# Patient Record
Sex: Male | Born: 1960 | Race: White | Hispanic: No | Marital: Married | Smoking: Former smoker
Health system: Southern US, Community
[De-identification: ages and names within clinical notes are randomized; demographics above are authoritative.]

## PROBLEM LIST (undated history)

## (undated) DIAGNOSIS — R51 Headache: Secondary | ICD-10-CM

## (undated) DIAGNOSIS — N400 Enlarged prostate without lower urinary tract symptoms: Secondary | ICD-10-CM

## (undated) DIAGNOSIS — K759 Inflammatory liver disease, unspecified: Secondary | ICD-10-CM

## (undated) DIAGNOSIS — K219 Gastro-esophageal reflux disease without esophagitis: Secondary | ICD-10-CM

## (undated) DIAGNOSIS — R519 Headache, unspecified: Secondary | ICD-10-CM

## (undated) DIAGNOSIS — N12 Tubulo-interstitial nephritis, not specified as acute or chronic: Secondary | ICD-10-CM

## (undated) DIAGNOSIS — A419 Sepsis, unspecified organism: Secondary | ICD-10-CM

## (undated) HISTORY — PX: EYE SURGERY: SHX253

## (undated) HISTORY — PX: COLONOSCOPY: SHX174

## (undated) HISTORY — PX: RETINAL LASER PROCEDURE: SHX2339

## (undated) HISTORY — DX: Benign prostatic hyperplasia without lower urinary tract symptoms: N40.0

## (undated) HISTORY — PX: LEG SURGERY: SHX1003

## (undated) HISTORY — DX: Inflammatory liver disease, unspecified: K75.9

---

## 2008-04-17 ENCOUNTER — Ambulatory Visit: Payer: Self-pay | Admitting: Urology

## 2008-04-24 ENCOUNTER — Ambulatory Visit: Payer: Self-pay | Admitting: Urology

## 2012-05-30 ENCOUNTER — Ambulatory Visit: Payer: Self-pay | Admitting: Unknown Physician Specialty

## 2016-04-21 ENCOUNTER — Other Ambulatory Visit: Payer: Self-pay | Admitting: Orthopedic Surgery

## 2016-04-21 DIAGNOSIS — M25562 Pain in left knee: Principal | ICD-10-CM

## 2016-04-21 DIAGNOSIS — G8929 Other chronic pain: Secondary | ICD-10-CM

## 2016-05-01 DIAGNOSIS — A419 Sepsis, unspecified organism: Secondary | ICD-10-CM

## 2016-05-01 DIAGNOSIS — N12 Tubulo-interstitial nephritis, not specified as acute or chronic: Secondary | ICD-10-CM

## 2016-05-01 HISTORY — DX: Tubulo-interstitial nephritis, not specified as acute or chronic: N12

## 2016-05-01 HISTORY — DX: Sepsis, unspecified organism: A41.9

## 2016-05-11 ENCOUNTER — Inpatient Hospital Stay
Admission: EM | Admit: 2016-05-11 | Discharge: 2016-05-14 | DRG: 872 | Disposition: A | Discharge status: Home / Self Care | Payer: No Typology Code available for payment source | Attending: Internal Medicine | Admitting: Internal Medicine

## 2016-05-11 ENCOUNTER — Encounter: Payer: Self-pay | Admitting: Emergency Medicine

## 2016-05-11 ENCOUNTER — Emergency Department: Payer: No Typology Code available for payment source

## 2016-05-11 ENCOUNTER — Emergency Department
Admission: EM | Admit: 2016-05-11 | Discharge: 2016-05-11 | Discharge status: Absent without leave | Payer: Self-pay | Attending: Emergency Medicine | Admitting: Emergency Medicine

## 2016-05-11 DIAGNOSIS — G8929 Other chronic pain: Secondary | ICD-10-CM

## 2016-05-11 DIAGNOSIS — S83242A Other tear of medial meniscus, current injury, left knee, initial encounter: Secondary | ICD-10-CM | POA: Diagnosis present

## 2016-05-11 DIAGNOSIS — A4151 Sepsis due to Escherichia coli [E. coli]: Secondary | ICD-10-CM | POA: Diagnosis present

## 2016-05-11 DIAGNOSIS — M25562 Pain in left knee: Secondary | ICD-10-CM

## 2016-05-11 DIAGNOSIS — N12 Tubulo-interstitial nephritis, not specified as acute or chronic: Secondary | ICD-10-CM | POA: Diagnosis present

## 2016-05-11 DIAGNOSIS — Z87891 Personal history of nicotine dependence: Secondary | ICD-10-CM

## 2016-05-11 DIAGNOSIS — A419 Sepsis, unspecified organism: Secondary | ICD-10-CM

## 2016-05-11 DIAGNOSIS — R109 Unspecified abdominal pain: Secondary | ICD-10-CM

## 2016-05-11 LAB — URINE DRUG SCREEN, QUALITATIVE (ARMC ONLY)
AMPHETAMINES, UR SCREEN: NOT DETECTED
BENZODIAZEPINE, UR SCRN: NOT DETECTED
Barbiturates, Ur Screen: NOT DETECTED
Cannabinoid 50 Ng, Ur ~~LOC~~: NOT DETECTED
Cocaine Metabolite,Ur ~~LOC~~: NOT DETECTED
MDMA (Ecstasy)Ur Screen: NOT DETECTED
METHADONE SCREEN, URINE: NOT DETECTED
OPIATE, UR SCREEN: NOT DETECTED
Phencyclidine (PCP) Ur S: NOT DETECTED
Tricyclic, Ur Screen: NOT DETECTED

## 2016-05-11 LAB — URINALYSIS COMPLETE WITH MICROSCOPIC (ARMC ONLY)
BACTERIA UA: NONE SEEN
BILIRUBIN URINE: NEGATIVE
Glucose, UA: NEGATIVE mg/dL
Hgb urine dipstick: NEGATIVE
Nitrite: NEGATIVE
PH: 9 — AB (ref 5.0–8.0)
Protein, ur: 100 mg/dL — AB
SQUAMOUS EPITHELIAL / LPF: NONE SEEN
Specific Gravity, Urine: 1.015 (ref 1.005–1.030)

## 2016-05-11 LAB — COMPREHENSIVE METABOLIC PANEL
ALBUMIN: 4.4 g/dL (ref 3.5–5.0)
ALT: 16 U/L — AB (ref 17–63)
AST: 26 U/L (ref 15–41)
Alkaline Phosphatase: 60 U/L (ref 38–126)
Anion gap: 11 (ref 5–15)
BUN: 15 mg/dL (ref 6–20)
CHLORIDE: 102 mmol/L (ref 101–111)
CO2: 22 mmol/L (ref 22–32)
CREATININE: 1.09 mg/dL (ref 0.61–1.24)
Calcium: 9.4 mg/dL (ref 8.9–10.3)
GFR calc Af Amer: >60 mL/min (ref >60)
GLUCOSE: 126 mg/dL — AB (ref 65–99)
Potassium: 3.7 mmol/L (ref 3.5–5.1)
SODIUM: 135 mmol/L (ref 135–145)
Total Bilirubin: 1.3 mg/dL — ABNORMAL HIGH (ref 0.3–1.2)
Total Protein: 7.3 g/dL (ref 6.5–8.1)

## 2016-05-11 LAB — GLUCOSE, CAPILLARY
GLUCOSE-CAPILLARY: 110 mg/dL — AB (ref 65–99)
Glucose-Capillary: 84 mg/dL (ref 65–99)

## 2016-05-11 LAB — CBC WITH DIFFERENTIAL/PLATELET
Basophils Absolute: 0 10*3/uL (ref 0–0.1)
Basophils Relative: 0 %
Eosinophils Absolute: 0 10*3/uL (ref 0–0.7)
Eosinophils Relative: 0 %
HCT: 42.7 % (ref 40.0–52.0)
HEMOGLOBIN: 14.4 g/dL (ref 13.0–18.0)
LYMPHS ABS: 0.9 10*3/uL — AB (ref 1.0–3.6)
LYMPHS PCT: 6 %
MCH: 29.4 pg (ref 26.0–34.0)
MCHC: 33.7 g/dL (ref 32.0–36.0)
MCV: 87.1 fL (ref 80.0–100.0)
Monocytes Absolute: 0.2 10*3/uL (ref 0.2–1.0)
Monocytes Relative: 1 %
NEUTROS ABS: 15.2 10*3/uL — AB (ref 1.4–6.5)
NEUTROS PCT: 93 %
Platelets: 253 10*3/uL (ref 150–440)
RBC: 4.9 MIL/uL (ref 4.40–5.90)
RDW: 13 % (ref 11.5–14.5)
WBC: 16.3 10*3/uL — AB (ref 3.8–10.6)

## 2016-05-11 LAB — TROPONIN I: Troponin I: <0.03 ng/mL (ref <0.03)

## 2016-05-11 LAB — ETHANOL: Alcohol, Ethyl (B): <5 mg/dL (ref <5)

## 2016-05-11 LAB — LACTIC ACID, PLASMA
Lactic Acid, Venous: 3.6 mmol/L (ref 0.5–1.9)
Lactic Acid, Venous: 2 mmol/L (ref 0.5–1.9)

## 2016-05-11 LAB — MRSA PCR SCREENING: MRSA by PCR: NEGATIVE

## 2016-05-11 MED ORDER — SODIUM CHLORIDE 0.9 % IV BOLUS (SEPSIS): 250.0000 mL | Freq: Once | INTRAVENOUS | Status: DC

## 2016-05-11 MED ORDER — SODIUM CHLORIDE 0.9 % IV BOLUS (SEPSIS)
1000.0000 mL | Freq: Once | INTRAVENOUS | Status: AC
  Administered 2016-05-11: 1000 mL via INTRAVENOUS

## 2016-05-11 MED ORDER — SODIUM CHLORIDE 0.9 % IV SOLN
INTRAVENOUS | Status: DC
  Administered 2016-05-11 – 2016-05-14 (×6): via INTRAVENOUS

## 2016-05-11 MED ORDER — SODIUM CHLORIDE 0.9% FLUSH
3.0000 mL | Freq: Two times a day (BID) | INTRAVENOUS | Status: DC
  Administered 2016-05-11 – 2016-05-12 (×3): 3 mL via INTRAVENOUS

## 2016-05-11 MED ORDER — ENOXAPARIN SODIUM 40 MG/0.4ML ~~LOC~~ SOLN
40.0000 mg | SUBCUTANEOUS | Status: DC
Start: 2016-05-11 — End: 2016-05-11

## 2016-05-11 MED ORDER — ZIPRASIDONE MESYLATE 20 MG IM SOLR
5.0000 mg | Freq: Once | INTRAMUSCULAR | Status: AC
  Administered 2016-05-11: 5 mg via INTRAMUSCULAR

## 2016-05-11 MED ORDER — PIPERACILLIN-TAZOBACTAM 3.375 G IVPB
3.3750 g | Freq: Three times a day (TID) | INTRAVENOUS | Status: DC
  Administered 2016-05-11 – 2016-05-12 (×2): 3.375 g via INTRAVENOUS
  Filled 2016-05-11 (×4): qty 50

## 2016-05-11 MED ORDER — VANCOMYCIN HCL IN DEXTROSE 1-5 GM/200ML-% IV SOLN
1000.0000 mg | Freq: Once | INTRAVENOUS | Status: AC
  Administered 2016-05-11: 1000 mg via INTRAVENOUS
  Filled 2016-05-11: qty 200

## 2016-05-11 MED ORDER — ACETAMINOPHEN 650 MG RE SUPP
650.0000 mg | Freq: Four times a day (QID) | RECTAL | Status: DC | PRN
  Administered 2016-05-11: 650 mg via RECTAL
  Filled 2016-05-11: qty 1

## 2016-05-11 MED ORDER — SODIUM CHLORIDE 0.9 % IV BOLUS (SEPSIS): 1000.0000 mL | Freq: Once | INTRAVENOUS | Status: DC

## 2016-05-11 MED ORDER — ONDANSETRON HCL 4 MG PO TABS: 4.0000 mg | ORAL_TABLET | Freq: Four times a day (QID) | ORAL | Status: DC | PRN

## 2016-05-11 MED ORDER — ONDANSETRON HCL 4 MG/2ML IJ SOLN
INTRAMUSCULAR | Status: AC
  Administered 2016-05-11: 4 mg
  Filled 2016-05-11: qty 2

## 2016-05-11 MED ORDER — OXYCODONE HCL 5 MG PO TABS: 5.0000 mg | ORAL_TABLET | ORAL | Status: DC | PRN

## 2016-05-11 MED ORDER — ACETAMINOPHEN 325 MG PO TABS
650.0000 mg | ORAL_TABLET | Freq: Four times a day (QID) | ORAL | Status: DC | PRN
Start: 2016-05-11 — End: 2016-05-14
  Administered 2016-05-11 – 2016-05-14 (×6): 650 mg via ORAL
  Filled 2016-05-11 (×5): qty 2

## 2016-05-11 MED ORDER — PIPERACILLIN-TAZOBACTAM 3.375 G IVPB 30 MIN
3.3750 g | Freq: Once | INTRAVENOUS | Status: AC
  Administered 2016-05-11: 3.375 g via INTRAVENOUS
  Filled 2016-05-11: qty 50

## 2016-05-11 MED ORDER — ENOXAPARIN SODIUM 40 MG/0.4ML ~~LOC~~ SOLN
40.0000 mg | SUBCUTANEOUS | Status: DC
  Administered 2016-05-11 – 2016-05-13 (×3): 40 mg via SUBCUTANEOUS
  Filled 2016-05-11 (×3): qty 0.4

## 2016-05-11 MED ORDER — SODIUM CHLORIDE 0.9 % IV BOLUS (SEPSIS)
500.0000 mL | Freq: Once | INTRAVENOUS | Status: AC
  Administered 2016-05-11: 500 mL via INTRAVENOUS

## 2016-05-11 MED ORDER — MORPHINE SULFATE (PF) 2 MG/ML IV SOLN: 2.0000 mg | INTRAVENOUS | Status: DC | PRN

## 2016-05-11 MED ORDER — ONDANSETRON HCL 4 MG/2ML IJ SOLN: 4.0000 mg | Freq: Four times a day (QID) | INTRAMUSCULAR | Status: DC | PRN

## 2016-05-11 MED ORDER — ZIPRASIDONE MESYLATE 20 MG IM SOLR
INTRAMUSCULAR | Status: AC
  Administered 2016-05-11: 5 mg via INTRAMUSCULAR
  Filled 2016-05-11: qty 20

## 2016-05-11 MED ORDER — VANCOMYCIN HCL IN DEXTROSE 1-5 GM/200ML-% IV SOLN
1000.0000 mg | Freq: Two times a day (BID) | INTRAVENOUS | Status: DC
  Administered 2016-05-11: 1000 mg via INTRAVENOUS
  Filled 2016-05-11 (×2): qty 200

## 2016-05-11 NOTE — ED Notes (Signed)
This RN entered room to check on patient.  Patient was naked on the stretcher, looking dazed and had removed both of his IVs.  This RN called for assistance and with Patsey BertholdMike C, Medic and Mardelle MatteAndy, Medic cleaned patient and put him in gown and back on monitor and started 2 new IVs.  Dr. Clint GuyHower notified.  Patient continues to be confused and somewhat combative.  Patsey BertholdMike C., Medic assigned to sit with patient at this time.

## 2016-05-11 NOTE — ED Notes (Signed)
Attempted to call report to ICU. RN to call me back

## 2016-05-11 NOTE — ED Notes (Addendum)
Pt fever reduced to 99.8 orally

## 2016-05-11 NOTE — ED Provider Notes (Signed)
[facility] Emergency Department Provider Note   ____________________________________________  Time seen: Approximately 1145am I have reviewed the triage vital signs and the triage nursing note.  HISTORY  Chief Complaint Altered Mental Status and Tachycardia   Historian Limited: Patient with altered mental status  HPI Ronald Fitzgerald is a 55 y.o. male with only past medical history, found with altered mental status outside [place] [facility]. He's found to be tachycardic and states that his back hurts, unclear if there is any trauma, however does not appear to have any evidence of trauma externally.  Patient is extremely limited historian given his altered mental status, states that he did not drive here then stated he did drive here. States he has back pain and states he does not have back pain. He states he does not have chest pain or trouble breathing.    History reviewed. No pertinent past medical history. Unknown  Patient Active Problem List   Diagnosis Date Noted  . Sepsis (HCC) 05/11/2016    History reviewed. No pertinent past surgical history. Unknown  No current outpatient prescriptions on file.  Allergies Review of patient's allergies indicates no known allergies.  History reviewed. No pertinent family history.  Social History Social History  Substance Use Topics  . Smoking status: Former Games developer  . Smokeless tobacco: None  . Alcohol Use: No    Review of Systems  Limited due to altered mental status.  Denies chest pain or cough or trouble breathing.  States that he had a fever, sound like he was subjective. Positive for low back pain and dysuria. Denies abdominal pain or vomiting. ____________________________________________   PHYSICAL EXAM:  VITAL SIGNS: ED Triage Vitals  Enc Vitals Group     BP --      Pulse --      Resp --      Temp --      Temp src --      SpO2 --      Weight --      Height --      Head Cir --    Peak Flow --      Pain Score --      Pain Loc --      Pain Edu? --      Excl. in GC? --      Constitutional: Opens his eyes to voice and states his name, otherwise history is inconsistent. No acute distress. HEENT   Head: Normocephalic and atraumatic.      Eyes: Conjunctivae are normal. PERRL. Normal extraocular movements.      Ears:         Nose: No congestion/rhinnorhea.   Mouth/Throat: Mucous membranes are moist.   Neck: No stridor. No C-spine tenderness. Cardiovascular/Chest: Tachycardic and regular.  No murmurs, rubs, or gallops. Respiratory: Normal respiratory effort without tachypnea nor retractions. Breath sounds are clear and equal bilaterally. No wheezes/rales/rhonchi. Gastrointestinal: Soft. No distention, no guarding, no rebound. Nontender.    Genitourinary/rectal:Deferred Musculoskeletal: Nontender with normal range of motion in all extremities. No joint effusions.  No lower extremity tenderness.  No edema. Neurologic:  No slurred speech or facial droop. No gross or focal neurologic deficits are appreciated. Skin:  Skin is warm, dry and intact. No rash noted.  ____________________________________________   EKG I, Governor Rooks, MD, the attending physician have personally viewed and interpreted all ECGs.  129 bpm. Sinus tachycardia. Narrow QRS. Right axis. Normal ST and T-wave ____________________________________________  LABS (pertinent positives/negatives)  Labs Reviewed  URINALYSIS  COMPLETEWITH MICROSCOPIC ([facility] ONLY) - Abnormal; Notable for the following:    Color, Urine YELLOW (*)    APPearance HAZY (*)    Ketones, ur TRACE (*)    pH 9.0 (*)    Protein, ur 100 (*)    Leukocytes, UA 2+ (*)    All other components within normal limits  COMPREHENSIVE METABOLIC PANEL - Abnormal; Notable for the following:    Glucose, Bld 126 (*)    ALT 16 (*)    Total Bilirubin 1.3 (*)    All other components within normal limits  LACTIC ACID, PLASMA - Abnormal;  Notable for the following:    Lactic Acid, Venous 3.6 (*)    All other components within normal limits  CBC WITH DIFFERENTIAL/PLATELET - Abnormal; Notable for the following:    WBC 16.3 (*)    Neutro Abs 15.2 (*)    Lymphs Abs 0.9 (*)    All other components within normal limits  GLUCOSE, CAPILLARY - Abnormal; Notable for the following:    Glucose-Capillary 110 (*)    All other components within normal limits  URINE CULTURE  CULTURE, BLOOD (ROUTINE X 2)  CULTURE, BLOOD (ROUTINE X 2)  URINE DRUG SCREEN, QUALITATIVE ([facility] ONLY)  ETHANOL  TROPONIN I  LACTIC ACID, PLASMA    ____________________________________________  RADIOLOGY All Xrays were viewed by me. Imaging interpreted by Radiologist.  CT head: IMPRESSION: No acute abnormality.  Mild ethmoid air cell disease.  Chest x-ray:  Negative chest __________________________________________  PROCEDURES  Procedure(s) performed: None  Critical Care performed: CRITICAL CARE Performed by: Governor RooksLORD, Mamta Rimmer   Total critical care time: 30 minutes  Critical care time was exclusive of separately billable procedures and treating other patients.  Critical care was necessary to treat or prevent imminent or life-threatening deterioration.  Critical care was time spent personally by me on the following activities: development of treatment plan with patient and/or surrogate as well as nursing, discussions with consultants, evaluation of patient's response to treatment, examination of patient, obtaining history from patient or surrogate, ordering and performing treatments and interventions, ordering and review of laboratory studies, ordering and review of radiographic studies, pulse oximetry and re-evaluation of patient's condition.   ____________________________________________   ED COURSE / ASSESSMENT AND PLAN  Pertinent labs & imaging results that were available during my care of the patient were reviewed by me and considered in my  medical decision making (see chart for details).   This patient arrived with altered mental status and found to be tachycardic and febrile concerning for sepsis. Also due to altered mental status, and being found outside unattended I am going to CT his head.   IV fluids and antibiotics were initiated based on undifferentiated sepsis in order to not delay treatment.  Laboratory studies show white blood cell count elevated, lactate 3.6 and I did initiate a 30 cc/kg IV fluid bolus. His urinalysis looks to be consistent with urinary tract infection and I will treat for pyelonephritis. Given his altered mental status and evidence of sepsis with elevated white blood cell count and high lactate, patient will be admitted to the hospitalist for further treatment.    CONSULTATIONS: Hospitalist for admission.   Patient / Family / Caregiver informed of clinical course, medical decision-making process, and agree with plan.    ___________________________________________   FINAL CLINICAL IMPRESSION(S) / ED DIAGNOSES   Final diagnoses:  Sepsis, due to unspecified organism [facility](HCC)  Pyelonephritis  Note: This dictation was prepared with Dragon dictation. Any transcriptional errors that result from this process are unintentional   Governor Rooks, MD 05/11/16 1459

## 2016-05-11 NOTE — H&P (Signed)
[facility] [place] [facility]   PATIENT NAME: Ronald Fitzgerald    MR#:  [id number]  DATE OF BIRTH:  1961/01/01   DATE OF ADMISSION:  05/11/2016  PRIMARY CARE PHYSICIAN: No primary care provider on file.   REQUESTING/REFERRING PHYSICIAN: lord  CHIEF COMPLAINT:   Chief Complaint  Patient presents with  . Altered Mental Status  . Tachycardia    HISTORY OF PRESENT ILLNESS:  Ronald Fitzgerald  is a 55 y.o. male with Unknown medical history, possible recurrent kidney injury per girlfriend who is presenting with altered mental status. Patient was found confused on the sidewalk hospital brought inside for further workup and evaluation. He is unable to provide meaningful information given mental status medical condition. He is remarkably confused anxious agitated thrashing out. He is found to be febrile with leukocytosis and code sepsis initiated  I have ordered the patient to receive Geodon while in the emergency department on my initial evaluation the patient was thrashing and trying to get out of bed and care and appeared to be a danger to himself as well as others. After receiving medication he did fortunately calm down  PAST MEDICAL HISTORY:  Unable to obtain given patient's mental status medical condition  PAST SURGICAL HISTORY:  Unable to obtain given patient's mental status medical condition  SOCIAL HISTORY:   Social History  Substance Use Topics  . Smoking status: Former Games developer  . Smokeless tobacco: Not on file  . Alcohol Use: No    FAMILY HISTORY:  Unable to obtain given patient's mental status medical condition  DRUG ALLERGIES:  No Known Allergies  REVIEW OF SYSTEMS:  Unable to obtain given patient's mental status medical condition  MEDICATIONS AT HOME:   Prior to Admission medications   Not on File      VITAL SIGNS:  Blood pressure 106/69, pulse 120, temperature 103.7 F (39.8 C), temperature source Rectal, resp. rate 23, height   (1.778 m), weight 165 lb (74.844 kg), SpO2 99 %.  PHYSICAL EXAMINATION:   VITAL SIGNS: Filed Vitals:   05/11/16 1200 05/11/16 1343  BP: 123/72 106/69  Pulse: 132 120  Temp: 100.1 F (37.8 C) 103.7 F (39.8 C)  Resp: 23    GENERAL:55 y.o.male moderate distress given mental status. Ill appearing HEAD: Normocephalic, atraumatic.  EYES: Pupils equal, round, reactive to light. Unable to assess extraocular muscles given mental status/medical condition. No scleral icterus.  MOUTH: Moist mucosal membrane. Dentition intact. No abscess noted.  EAR, NOSE, THROAT: Clear without exudates. No external lesions.  NECK: Supple. No thyromegaly. No nodules. No JVD.  PULMONARY: Clear to ascultation, without wheeze rails or rhonci. No use of accessory muscles, Good respiratory effort. good air entry bilaterally CHEST: Nontender to palpation.  CARDIOVASCULAR: S1 and S2. Tachycardic rate and rhythm. No murmurs, rubs, or gallops. No edema. Pedal pulses 2+ bilaterally.  GASTROINTESTINAL: Soft, nontender, nondistended. No masses. Positive bowel sounds. No hepatosplenomegaly.  MUSCULOSKELETAL: No swelling, clubbing, or edema. Range of motion full in all extremities.  NEUROLOGIC: Unable to assess given mental status/medical condition SKIN: No ulceration, lesions, rashes, or cyanosis. Skin hot and dry. Turgor intact.  PSYCHIATRIC: Unable to assess given mental status/medical condition patient is oriented 1 currently, anxious confused combative     LABORATORY PANEL:   CBC  Recent Labs Lab 05/11/16 1159  WBC 16.3*  HGB 14.4  HCT 42.7  PLT 253   ------------------------------------------------------------------------------------------------------------------  Chemistries   Recent Labs Lab 05/11/16 1159  NA 135  K  3.7  CL 102  CO2 22  GLUCOSE 126*  BUN 15  CREATININE 1.09  CALCIUM 9.4  AST 26  ALT 16*  ALKPHOS 60  BILITOT 1.3*    ------------------------------------------------------------------------------------------------------------------  Cardiac Enzymes  Recent Labs Lab 05/11/16 1159  TROPONINI <0.03   ------------------------------------------------------------------------------------------------------------------  RADIOLOGY:  Ct Head Wo Contrast  05/11/2016  CLINICAL DATA:  Patient found down today.  Altered mental status. EXAM: CT HEAD WITHOUT CONTRAST TECHNIQUE: Contiguous axial images were obtained from the base of the skull through the vertex without intravenous contrast. COMPARISON:  None. FINDINGS: The brain appears normal without hemorrhage, infarct, mass lesion, mass effect, midline shift or abnormal extra-axial fluid collection. No hydrocephalus or pneumocephalus. The calvarium is intact. Mild mucosal thickening is seen in the ethmoid air cells and right frontal sinus. IMPRESSION: No acute abnormality. Mild ethmoid air cell disease. Electronically Signed   By: Drusilla Kannerhomas  Dalessio M.D.   On: 05/11/2016 12:37    EKG:   Orders placed or performed during the hospital encounter of 05/11/16  . ED EKG  . ED EKG    IMPRESSION AND PLAN:   55 year old gentleman unknown medical history presenting with altered mental status  1.Sepsis, meeting septic criteria by temperature, leukocytosis present on arrival. Source urinary tract infection site unspecified Code sepsis initiated. Panculture. Broad-spectrum antibiotics including vancomycin and Zosyn and taper antibiotics when culture data returns-would not be surprised if patient bacteremic.  Has received a 30 mL/kg IV fluid bolus. Continue IV fluid hydration to keep mean arterial pressure greater than 65. may require pressor therapy if blood pressure worsens. We will repeat lactic acid if the initial is greater than 2.2.      All the records are reviewed and case discussed with ED provider. Management plans discussed with the patient, family and they are in  agreement.  CODE STATUS: Full  TOTAL TIME TAKING CARE OF THIS PATIENT: 40 critical minutes.    Esbeydi Manago,  Mardi MainlandDavid K M.D on 05/11/2016 at 1:56 PM  Between 7am to 6pm - Pager - [phone]  After 6pm: House Pager: - [phone]  [facility]  Office  [phone]  CC: Primary care physician; No primary care provider on file.

## 2016-05-11 NOTE — ED Notes (Signed)
Pt seems to be more alert & oriented. Oriented to self, time, location, situation

## 2016-05-11 NOTE — ED Notes (Signed)
Code  Sepsis  Called  To  Doug  At  [facility] 

## 2016-05-11 NOTE — ED Notes (Signed)
Pt resting with Medic Mike at bedside. Pt states he wants to get up and that he feels "pretty good." Pt encouraged to stay in bed and rest.

## 2016-05-11 NOTE — ED Notes (Signed)
[facility]. Pt oriented to person, otherwise altered. Pt HR 133; MD Lord @ bedside.

## 2016-05-11 NOTE — ED Provider Notes (Signed)
   EKG reviewed and interpretted by myself shows sinus tachycardia at 129bpm with narrow QRS, normal axis, normal intervals, nonspecific ST changes no st elevations.    Minna AntisKevin Frankye Schwegel, MD 05/11/16 [phone]

## 2016-05-11 NOTE — ED Notes (Signed)
[facility] and was brought to ED. Pt has altered mental status; oriented to self only. Pt rambling.

## 2016-05-11 NOTE — Progress Notes (Signed)
Pharmacy Antibiotic Note  Molinda BailiffWilliam R Brisendine is a 55 y.o. male admitted on 05/11/2016 with sepsis.  Pharmacy has been consulted for Zosyn and Vancomycin dosing. Patient received vancomycin 1gm IV and Zosyn 3.375 Iv x1 in ED.  Plan: CrCl: 1.77  Scr: 1.09  Ke: 0.068  Vd: 52.5   T1/2: 10.2  Will start patient on Vancomycin 1gm IV every 12 hours with 6 hour stack dosing. Calculated trough at Css is 16.4.  Will order trough prior to 5 dose.   Will start patient on Zosyn 3.375 IV EI every 8 hours.   Height: 5\' 10"  (177.8 cm) Weight: 165 lb (74.844 kg) IBW/kg (Calculated) : 73  Temp (24hrs), Avg:101.9 F (38.8 C), Min:100.1 F (37.8 C), Max:103.7 F (39.8 C)   Recent Labs Lab 05/11/16 1159  WBC 16.3*  CREATININE 1.09  LATICACIDVEN 3.6*    Estimated Creatinine Clearance: 77.2 mL/min (by C-G formula based on Cr of 1.09).    No Known Allergies  Antimicrobials this admission: 7/11 Zosyn >>  7/11 Vancomcyin >>   Microbiology results: 7/11 BCx: pending 7/11 UCx: pending  Thank you for allowing pharmacy to be a part of this patient's care.  Cher NakaiSheema Amisadai Woodford, PharmD Clinical Pharmacist 05/11/2016 2:13 PM

## 2016-05-12 ENCOUNTER — Inpatient Hospital Stay: Payer: No Typology Code available for payment source

## 2016-05-12 ENCOUNTER — Ambulatory Visit: Payer: PRIVATE HEALTH INSURANCE

## 2016-05-12 LAB — BASIC METABOLIC PANEL
ANION GAP: 4 — AB (ref 5–15)
BUN: 14 mg/dL (ref 6–20)
CALCIUM: 7.7 mg/dL — AB (ref 8.9–10.3)
CO2: 23 mmol/L (ref 22–32)
Chloride: 110 mmol/L (ref 101–111)
Creatinine, Ser: 0.96 mg/dL (ref 0.61–1.24)
GFR calc Af Amer: >60 mL/min (ref >60)
GFR calc non Af Amer: >60 mL/min (ref >60)
GLUCOSE: 114 mg/dL — AB (ref 65–99)
Potassium: 3.4 mmol/L — ABNORMAL LOW (ref 3.5–5.1)
Sodium: 137 mmol/L (ref 135–145)

## 2016-05-12 LAB — CBC
HEMATOCRIT: 35.7 % — AB (ref 40.0–52.0)
HEMOGLOBIN: 12.2 g/dL — AB (ref 13.0–18.0)
MCH: 29.9 pg (ref 26.0–34.0)
MCHC: 34.1 g/dL (ref 32.0–36.0)
MCV: 87.7 fL (ref 80.0–100.0)
Platelets: 172 10*3/uL (ref 150–440)
RBC: 4.07 MIL/uL — ABNORMAL LOW (ref 4.40–5.90)
RDW: 13.5 % (ref 11.5–14.5)
WBC: 22.5 10*3/uL — ABNORMAL HIGH (ref 3.8–10.6)

## 2016-05-12 LAB — BLOOD CULTURE ID PANEL (REFLEXED)
Acinetobacter baumannii: NOT DETECTED
CANDIDA GLABRATA: NOT DETECTED
CANDIDA KRUSEI: NOT DETECTED
CANDIDA TROPICALIS: NOT DETECTED
CARBAPENEM RESISTANCE: NOT DETECTED
Candida albicans: NOT DETECTED
Candida parapsilosis: NOT DETECTED
ENTEROBACTER CLOACAE COMPLEX: NOT DETECTED
ESCHERICHIA COLI: DETECTED — AB
Enterobacteriaceae species: NOT DETECTED
Enterococcus species: NOT DETECTED
HAEMOPHILUS INFLUENZAE: NOT DETECTED
KLEBSIELLA PNEUMONIAE: NOT DETECTED
Klebsiella oxytoca: NOT DETECTED
LISTERIA MONOCYTOGENES: NOT DETECTED
METHICILLIN RESISTANCE: NOT DETECTED
NEISSERIA MENINGITIDIS: NOT DETECTED
PROTEUS SPECIES: NOT DETECTED
Pseudomonas aeruginosa: NOT DETECTED
SERRATIA MARCESCENS: NOT DETECTED
STAPHYLOCOCCUS SPECIES: NOT DETECTED
Staphylococcus aureus (BCID): NOT DETECTED
Streptococcus agalactiae: NOT DETECTED
Streptococcus pneumoniae: NOT DETECTED
Streptococcus pyogenes: NOT DETECTED
Streptococcus species: NOT DETECTED
Vancomycin resistance: NOT DETECTED

## 2016-05-12 MED ORDER — DEXTROSE 5 % IV SOLN
2.0000 g | INTRAVENOUS | Status: DC
  Administered 2016-05-12 – 2016-05-13 (×2): 2 g via INTRAVENOUS
  Filled 2016-05-12 (×3): qty 2

## 2016-05-12 MED ORDER — IBUPROFEN 600 MG PO TABS
600.0000 mg | ORAL_TABLET | Freq: Once | ORAL | Status: AC
  Administered 2016-05-12: 600 mg via ORAL
  Filled 2016-05-12: qty 1

## 2016-05-12 MED ORDER — SODIUM CHLORIDE 0.9 % IV BOLUS (SEPSIS)
500.0000 mL | Freq: Once | INTRAVENOUS | Status: AC
  Administered 2016-05-12: 500 mL via INTRAVENOUS

## 2016-05-12 NOTE — Progress Notes (Signed)
Pt returned from MRI in stable condition. Pt was observed to be shivering and stated he "was very cold". Pt temp checked, resulted at 99.6. Room temp increased, and pt was provided with a heated blanket.  Will call report to 2A. Will continue to assess.

## 2016-05-12 NOTE — Progress Notes (Signed)
Attempted to call report to nurse on 2C twice. Will call back again.

## 2016-05-12 NOTE — Progress Notes (Signed)
Pt came to floor at 1145. VSS. Pt was A&O. Pt was oriented to room and safety plan. Urinal at bedside. Temp was elevated. Tylenol given and 2 heated blankets were removed off of the pt. The room temp was also set at 80 degrees. The room temp was decreased down to 74 degrees. Upon recheck, Temp was 101.

## 2016-05-12 NOTE — Progress Notes (Signed)
PHARMACY - PHYSICIAN COMMUNICATION CRITICAL VALUE ALERT - BLOOD CULTURE IDENTIFICATION (BCID)  Results for orders placed or performed during the hospital encounter of 05/11/16  Blood Culture ID Panel (Reflexed) (Collected: 05/11/2016 12:15 PM)  Result Value Ref Range   Enterococcus species NOT DETECTED NOT DETECTED   Vancomycin resistance NOT DETECTED NOT DETECTED   Listeria monocytogenes NOT DETECTED NOT DETECTED   Staphylococcus species NOT DETECTED NOT DETECTED   Staphylococcus aureus NOT DETECTED NOT DETECTED   Methicillin resistance NOT DETECTED NOT DETECTED   Streptococcus species NOT DETECTED NOT DETECTED   Streptococcus agalactiae NOT DETECTED NOT DETECTED   Streptococcus pneumoniae NOT DETECTED NOT DETECTED   Streptococcus pyogenes NOT DETECTED NOT DETECTED   Acinetobacter baumannii NOT DETECTED NOT DETECTED   Enterobacteriaceae species NOT DETECTED NOT DETECTED   Enterobacter cloacae complex NOT DETECTED NOT DETECTED   Escherichia coli DETECTED (A) NOT DETECTED   Klebsiella oxytoca NOT DETECTED NOT DETECTED   Klebsiella pneumoniae NOT DETECTED NOT DETECTED   Proteus species NOT DETECTED NOT DETECTED   Serratia marcescens NOT DETECTED NOT DETECTED   Carbapenem resistance NOT DETECTED NOT DETECTED   Haemophilus influenzae NOT DETECTED NOT DETECTED   Neisseria meningitidis NOT DETECTED NOT DETECTED   Pseudomonas aeruginosa NOT DETECTED NOT DETECTED   Candida albicans NOT DETECTED NOT DETECTED   Candida glabrata NOT DETECTED NOT DETECTED   Candida krusei NOT DETECTED NOT DETECTED   Candida parapsilosis NOT DETECTED NOT DETECTED   Candida tropicalis NOT DETECTED NOT DETECTED    Name of physician (or Provider) Contacted: Dr. Allena KatzPatel   Changes to prescribed antibiotics required: D/C Vancomycin and Zosyn. Start ceftriaxone 2g IV Q24hr.   Ra Pfiester L 05/12/2016  8:50 AM

## 2016-05-12 NOTE — Progress Notes (Signed)
Extra dose of 650 mg of Tylenol was given for fever. Will continue to assess. Ice bags placed on pts armpits.

## 2016-05-12 NOTE — Progress Notes (Signed)
Pt's son was called back for an update. Voicemail picked up. Message was not left because it sounded like it might be a business phone.

## 2016-05-12 NOTE — Progress Notes (Signed)
[facility] [place] at [facility]   PATIENT NAME: Algie Westry    MR#:  [id number]  DATE OF BIRTH:  01-Jun-1961  SUBJECTIVE:  Came in with high garde fever, and dysuria. Found to be in AMS and code sepsis was initiated Pt doing well. No symptoms this am.  REVIEW OF SYSTEMS:   Review of Systems  Constitutional: Negative for fever, chills and weight loss.  HENT: Negative for ear discharge, ear pain and nosebleeds.   Eyes: Negative for blurred vision, pain and discharge.  Respiratory: Negative for sputum production, shortness of breath, wheezing and stridor.   Cardiovascular: Negative for chest pain, palpitations, orthopnea and PND.  Gastrointestinal: Negative for nausea, vomiting, abdominal pain and diarrhea.  Genitourinary: Positive for dysuria. Negative for urgency and frequency.  Musculoskeletal: Negative for back pain and joint pain.  Neurological: Positive for weakness. Negative for sensory change, speech change and focal weakness.  Psychiatric/Behavioral: Negative for depression and hallucinations. The patient is not nervous/anxious.   All other systems reviewed and are negative.  Tolerating Diet:yes Tolerating PT: not needed  DRUG ALLERGIES:  No Known Allergies  VITALS:  Blood pressure 92/58, pulse 81, temperature 98.8 F (37.1 C), temperature source Oral, resp. rate 20, height  (1.778 m), weight 75 kg (165 lb 5.5 oz), SpO2 96 %.  PHYSICAL EXAMINATION:   Physical Exam  GENERAL:  55 y.o.-year-old patient lying in the bed with no acute distress.  EYES: Pupils equal, round, reactive to light and accommodation. No scleral icterus. Extraocular muscles intact.  HEENT: Head atraumatic, normocephalic. Oropharynx and nasopharynx clear.  NECK:  Supple, no jugular venous distention. No thyroid enlargement, no tenderness.  LUNGS: Normal breath sounds bilaterally, no wheezing, rales, rhonchi. No use of accessory muscles of respiration.   CARDIOVASCULAR: S1, S2 normal. No murmurs, rubs, or gallops.  ABDOMEN: Soft, nontender, nondistended. Bowel sounds present. No organomegaly or mass.  EXTREMITIES: No cyanosis, clubbing or edema b/l.    NEUROLOGIC: Cranial nerves II through XII are intact. No focal Motor or sensory deficits b/l.   PSYCHIATRIC:  patient is alert and oriented x 3.  SKIN: No obvious rash, lesion, or ulcer.   LABORATORY PANEL:  CBC  Recent Labs Lab 05/12/16 0302  WBC 22.5*  HGB 12.2*  HCT 35.7*  PLT 172    Chemistries   Recent Labs Lab 05/11/16 1159 05/12/16 0302  NA 135 137  K 3.7 3.4*  CL 102 110  CO2 22 23  GLUCOSE 126* 114*  BUN 15 14  CREATININE 1.09 0.96  CALCIUM 9.4 7.7*  AST 26  --   ALT 16*  --   ALKPHOS 60  --   BILITOT 1.3*  --    Cardiac Enzymes  Recent Labs Lab 05/11/16 1159  TROPONINI <0.03   RADIOLOGY:  Ct Head Wo Contrast  05/11/2016  CLINICAL DATA:  Patient found down today.  Altered mental status. EXAM: CT HEAD WITHOUT CONTRAST TECHNIQUE: Contiguous axial images were obtained from the base of the skull through the vertex without intravenous contrast. COMPARISON:  None. FINDINGS: The brain appears normal without hemorrhage, infarct, mass lesion, mass effect, midline shift or abnormal extra-axial fluid collection. No hydrocephalus or pneumocephalus. The calvarium is intact. Mild mucosal thickening is seen in the ethmoid air cells and right frontal sinus. IMPRESSION: No acute abnormality. Mild ethmoid air cell disease. Electronically Signed   By: Drusilla Kanner M.D.   On: 05/11/2016 12:37   Dg Chest Port 1 View  05/11/2016  CLINICAL DATA:  Patient found down today. EXAM: PORTABLE CHEST 1 VIEW COMPARISON:  None. FINDINGS: The lungs are clear. Heart size is normal. No pneumothorax or pleural effusion. No bony abnormality. IMPRESSION: Negative chest. Electronically Signed   By: Drusilla Kannerhomas  Dalessio M.D.   On: 05/11/2016 14:01   ASSESSMENT AND PLAN:   Wardell HeathWilliam Mancera is a 55  y.o. 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 Patient was found confused on the sidewalk hospital brought inside for further workup and evaluation.He wass found to be febrile with leukocytosis and code sepsis initiated  * Ecoli Sepsis-source urine Presented with high grade fever, tachycardia, dysuria, and leucocytosis -BC positive for GNR -Biofire positive for Ecoli -IV zosyn -follow wbc -d/c vanc  *Hypotension in the setting of sepsis -received aggressive IVF -BP stable -tolerating po diet  *Leucocytosis  Due to sepsis  *chronic knee pain -pt has outpt MRI scheduled for today -will try to get it done while he is here  *DVT prophylaxis -Lovenox  Transfer to medical floor  Case discussed with Care Management/Social Worker. Management plans discussed with the patient, family and they are in agreement.  CODE STATUS: full  TOTAL TIME TAKING CARE OF THIS PATIENT: 35 minutes.  >50% time spent on counselling and coordination of care  POSSIBLE D/C IN1-2 DAYS, DEPENDING ON CLINICAL CONDITION.  Note: This dictation was prepared with Dragon dictation along with smaller phrase technology. Any transcriptional errors that result from this process are unintentional.  Castiel Lauricella M.D on 05/12/2016 at 8:21 AM  Between 7am to 6pm - Pager - [phone]  After 6pm go to [email or web address] - password [email or web address] [facility]  [facility]  Office  [phone]  CC: Primary care physician; No primary care provider on file.

## 2016-05-12 NOTE — Progress Notes (Signed)
MD was notified of pt's temperature. Orders given to give pt 650 mg of Tylenol at this time. If it doesn't come down, can give another 650 mg. Will continue to assess.

## 2016-05-13 LAB — CBC
HEMATOCRIT: 36 % — AB (ref 40.0–52.0)
HEMOGLOBIN: 12.2 g/dL — AB (ref 13.0–18.0)
MCH: 30.2 pg (ref 26.0–34.0)
MCHC: 34 g/dL (ref 32.0–36.0)
MCV: 88.8 fL (ref 80.0–100.0)
Platelets: 148 10*3/uL — ABNORMAL LOW (ref 150–440)
RBC: 4.06 MIL/uL — AB (ref 4.40–5.90)
RDW: 13.4 % (ref 11.5–14.5)
WBC: 17.1 10*3/uL — ABNORMAL HIGH (ref 3.8–10.6)

## 2016-05-13 LAB — URINE CULTURE: Culture: >=100000 — AB

## 2016-05-13 LAB — C DIFFICILE QUICK SCREEN W PCR REFLEX
C Diff antigen: NEGATIVE
C Diff interpretation: NOT DETECTED
C Diff toxin: NEGATIVE

## 2016-05-13 MED ORDER — ALUM & MAG HYDROXIDE-SIMETH 200-200-20 MG/5ML PO SUSP: 30.0000 mL | Freq: Four times a day (QID) | ORAL | Status: DC | PRN

## 2016-05-13 MED ORDER — POTASSIUM CHLORIDE CRYS ER 20 MEQ PO TBCR
20.0000 meq | EXTENDED_RELEASE_TABLET | Freq: Once | ORAL | Status: AC
  Administered 2016-05-13: 20 meq via ORAL
  Filled 2016-05-13: qty 1

## 2016-05-13 NOTE — Progress Notes (Signed)
[facility] - [place] at [facility]   PATIENT NAME: Ronald Fitzgerald    MR#:  [id number]  DATE OF BIRTH:  [birth year]  SUBJECTIVE:  Came in with high garde fever, and dysuria. Found to be in AMS and code sepsis was initiated  Patient had fever yesterday up to 103 Fahrenheit, but the temperature is coming down.  He  says he is feeling overall better but just weak  REVIEW OF SYSTEMS:   Review of Systems  Constitutional: Negative for fever, chills and weight loss.  HENT: Negative for ear discharge, ear pain and nosebleeds.   Eyes: Negative for blurred vision, pain and discharge.  Respiratory: Negative for sputum production, shortness of breath, wheezing and stridor.   Cardiovascular: Negative for chest pain, palpitations, orthopnea and PND.  Gastrointestinal: Negative for nausea, vomiting, abdominal pain and diarrhea.  Genitourinary: Negative for dysuria, urgency and frequency.  Musculoskeletal: Negative for back pain and joint pain.  Neurological: Positive for weakness. Negative for sensory change, speech change and focal weakness.  Psychiatric/Behavioral: Negative for depression and hallucinations. The patient is not nervous/anxious.   All other systems reviewed and are negative.  Tolerating Diet:yes Tolerating PT: not needed  DRUG ALLERGIES:  No Known Allergies  VITALS:  Blood pressure 119/72, pulse 82, temperature 100.2 F (37.9 C), temperature source Oral, resp. rate 19, height 5\' 10"  (1.778 m), weight 75 kg (165 lb 5.5 oz), SpO2 99 %.  PHYSICAL EXAMINATION:   Physical Exam  GENERAL:  55 y.o.-year-old patient lying in the bed with no acute distress.  EYES: Pupils equal, round, reactive to light and accommodation. No scleral icterus. Extraocular muscles intact.  HEENT: Head atraumatic, normocephalic. Oropharynx and nasopharynx clear.  NECK:  Supple, no jugular venous distention. No thyroid enlargement, no tenderness.  LUNGS: Normal breath sounds  bilaterally, no wheezing, rales, rhonchi. No use of accessory muscles of respiration.  CARDIOVASCULAR: S1, S2 normal. No murmurs, rubs, or gallops.  ABDOMEN: Soft, nontender, nondistended. Bowel sounds present. No organomegaly or mass.  EXTREMITIES: No cyanosis, clubbing or edema b/l.   Mild tenderness on the medial aspect of the left  Knee. NEUROLOGIC: Cranial nerves II through XII are intact. No focal Motor or sensory deficits b/l.   PSYCHIATRIC:  patient is alert and oriented x 3.  SKIN: No obvious rash, lesion, or ulcer.   LABORATORY PANEL:  CBC  Recent Labs Lab 05/13/16 0419  WBC 17.1*  HGB 12.2*  HCT 36.0*  PLT 148*    Chemistries   Recent Labs Lab 05/11/16 1159 05/12/16 0302  NA 135 137  K 3.7 3.4*  CL 102 110  CO2 22 23  GLUCOSE 126* 114*  BUN 15 14  CREATININE 1.09 0.96  CALCIUM 9.4 7.7*  AST 26  --   ALT 16*  --   ALKPHOS 60  --   BILITOT 1.3*  --    Cardiac Enzymes  Recent Labs Lab 05/11/16 1159  TROPONINI <0.03   RADIOLOGY:  Ct Head Wo Contrast  05/11/2016  CLINICAL DATA:  Patient found down today.  Altered mental status. EXAM: CT HEAD WITHOUT CONTRAST TECHNIQUE: Contiguous axial images were obtained from the base of the skull through the vertex without intravenous contrast. COMPARISON:  None. FINDINGS: The brain appears normal without hemorrhage, infarct, mass lesion, mass effect, midline shift or abnormal extra-axial fluid collection. No hydrocephalus or pneumocephalus. The calvarium is intact. Mild mucosal thickening is seen in the ethmoid air cells and right frontal sinus. IMPRESSION: No acute abnormality. Mild  ethmoid air cell disease. Electronically Signed   By: Drusilla Kanner M.D.   On: 05/11/2016 12:37   Mr Knee Left  Wo Contrast  05/12/2016  CLINICAL DATA:  Left knee pain. Possible injury running or hiking 6 months ago. Initial encounter. EXAM: MRI OF THE LEFT KNEE WITHOUT CONTRAST TECHNIQUE: Multiplanar, multisequence MR imaging of the knee  was performed. No intravenous contrast was administered. COMPARISON:  None. FINDINGS: MENISCI Medial meniscus:  Longitudinal tear is seen in the posterior horn. Lateral meniscus:  Intact. LIGAMENTS Cruciates:  Intact. Collaterals:  Intact. CARTILAGE Patellofemoral: A small focal fissure is seen in the far periphery of cartilage of the medial facet in the inferior pole. Medial: Small focus of hyaline cartilage loss is seen in the posterior medial femoral condyle with mild underlying subchondral edema. Lateral:  Unremarkable. Joint:  Trace amount of joint fluid. Popliteal Fossa:  No Baker's cyst. Extensor Mechanism:  Intact. Bones:  No fracture or worrisome marrow lesion. Other: None IMPRESSION: Longitudinal tear posterior horn medial meniscus. Mild medial and patellofemoral degenerative change for Electronically Signed   By: Drusilla Kanner M.D.   On: 05/12/2016 10:40   Dg Chest Port 1 View  05/11/2016  CLINICAL DATA:  Patient found down today. EXAM: PORTABLE CHEST 1 VIEW COMPARISON:  None. FINDINGS: The lungs are clear. Heart size is normal. No pneumothorax or pleural effusion. No bony abnormality. IMPRESSION: Negative chest. Electronically Signed   By: Drusilla Kanner M.D.   On: 05/11/2016 14:01   ASSESSMENT AND PLAN:   Ronald Fitzgerald is a 55 y.o. 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 Patient was found confused on the sidewalk hospital brought inside for further workup and evaluation.He wass found to be febrile with leukocytosis and code sepsis initiated  * Ecoli Sepsis-source urine Presented with high grade fever, tachycardia, dysuria, and leucocytosis -BC positive for GNR -Biofire positive for Ecoli -Started on IV ceftriaxone 2 g daily, follow repeat blood cultures. Patient has to be afebrile for 24 hours for discharge planning. See Ms. discussed the patient. -follow wbc,wbc coming down. Needs to follow up  with urology as an out pt for possible BPH/Prostatitis  will get appointment with [place] [facility].  *Hypotension in the setting of sepsis -received aggressive IVF -That is better. Continue IV fluids today, discontinue tomorrow.    *chronic knee pain on the left kidney: MRI of the left knee showed meniscal tear on the left medial meniscus Will consult ortho  for possible out pt meniscal repair -  *DVT prophylaxis -Lovenox   Case discussed with Care Management/Social Worker. Management plans discussed with the patient, family and they are in agreement.  CODE STATUS: full  TOTAL TIME TAKING CARE OF THIS PATIENT: 35 minutes.  >50% time spent on counselling and coordination of care  POSSIBLE D/C IN1-2 DAYS, DEPENDING ON CLINICAL CONDITION.  Note: This dictation was prepared with Dragon dictation along with smaller phrase technology. Any transcriptional errors that result from this process are unintentional.  Ronald Fitzgerald M.D on 05/13/2016 at 9:39 AM  Between 7am to 6pm - Pager - [phone]  After 6pm go to [email or web address] - password [email or web address] [facility]  [place] [facility]  Office  [phone]  CC: Primary care physician; No primary care provider on file.

## 2016-05-13 NOTE — Consult Note (Signed)
ORTHOPAEDIC CONSULTATION  REQUESTING PHYSICIAN: Katha HammingSnehalatha Konidena, MD  Chief Complaint:   Left knee pain  History of Present Illness: Ronald Fitzgerald is a 55 y.o. male in otherwise good health who was admitted to the hospital several days ago with sepsis secondary to an apparent urinary tract infection. He had been scheduled to undergo an MRI scan of the left knee yesterday, so the scan was completed while he was in the hospital. By report, the MRI scan demonstrates evidence of a medial meniscus tear, prompting this orthopedic consultation. The patient notes that he has had medial sided left knee pain for over 6 months. He does not recall any specific injury, but states that he does do a lot of hiking and exercising. He tried to "work it out" but his symptoms gradually worsened instead of improving, prompting [facility] to [facility] [place]. Based on his history and examination, apparently concern was raised for a medial meniscus tear, prompting the scheduling of the above-mentioned MRI scan. The patient denies any remote history of injury to the left knee. He also denies any locking, catching, or giving way episodes.  History reviewed. No pertinent past medical history. History reviewed. No pertinent past surgical history. Social History   Social History  . Marital Status: Married    Spouse Name: N/A  . Number of Children: N/A  . Years of Education: N/A   Social History Main Topics  . Smoking status: Former Games developermoker  . Smokeless tobacco: None  . Alcohol Use: No  . Drug Use: None  . Sexual Activity: Not Asked   Other Topics Concern  . None   Social History Narrative   History reviewed. No pertinent family history. No Known Allergies Prior to Admission medications   Not on File   Mr Knee Left  Wo Contrast  05/12/2016  CLINICAL DATA:  Left knee pain. Possible injury running or hiking 6 months ago.  Initial encounter. EXAM: MRI OF THE LEFT KNEE WITHOUT CONTRAST TECHNIQUE: Multiplanar, multisequence MR imaging of the knee was performed. No intravenous contrast was administered. COMPARISON:  None. FINDINGS: MENISCI Medial meniscus:  Longitudinal tear is seen in the posterior horn. Lateral meniscus:  Intact. LIGAMENTS Cruciates:  Intact. Collaterals:  Intact. CARTILAGE Patellofemoral: A small focal fissure is seen in the far periphery of cartilage of the medial facet in the inferior pole. Medial: Small focus of hyaline cartilage loss is seen in the posterior medial femoral condyle with mild underlying subchondral edema. Lateral:  Unremarkable. Joint:  Trace amount of joint fluid. Popliteal Fossa:  No Baker's cyst. Extensor Mechanism:  Intact. Bones:  No fracture or worrisome marrow lesion. Other: None IMPRESSION: Longitudinal tear posterior horn medial meniscus. Mild medial and patellofemoral degenerative change for Electronically Signed   By: Drusilla Kannerhomas  Dalessio M.D.   On: 05/12/2016 10:40    Positive ROS: All other systems have been reviewed and were otherwise negative with the exception of those mentioned in the HPI and as above.  Physical Exam: General:  Alert, no acute distress Psychiatric:  Patient is competent for consent with normal mood and affect   Cardiovascular:  No pedal edema Respiratory:  No wheezing, non-labored breathing GI:  Abdomen is soft and non-tender Skin:  No lesions in the area of chief complaint Neurologic:  Sensation intact distally Lymphatic:  No axillary or cervical lymphadenopathy  Orthopedic Exam:  Orthopedic examination is limited to the left knee and left lower extremity. Skin inspection around the knee is unremarkable. There is no  swelling, effusion, or other skin abnormalities. He has mild-moderate focal tenderness to palpation along the medial joint line, but has no lateral joint line tenderness and no peripatellar tenderness. He is able to extend his knee fully and  flex beyond 135 with only minimal discomfort in maximum flexion. He has a positive McMurray's test. He has no ligamentous laxity with a negative Lachman's test, a negative pivot shift, and no laxity to varus or valgus stressing. He is neurovascularly intact to the left lower extremity and foot.  X-rays:  A recent MRI scan of the left knee is available for review. By report, the MRI scan images evidence of a complex tear of the posterior medial portion of the medial meniscus with some bone marrow edema involving the weightbearing portion of the medial femoral condyle. The lateral meniscus is intact, as are the ligaments. No other significant bony abnormalities are identified. There are some mild degenerative changes of the medial compartment and patellofemoral compartments. Both the films and report were reviewed by myself and discussed with the patient.  Assessment: Medial meniscus tear left knee.  Plan: The treatment options were discussed with the patient. Given that the symptoms have been present for over 6 months and have not responded to medications, activity modification, etc., the patient would like to consider surgical intervention, specifically an arthroscopic procedure. However, given that he is just getting over a pretty severe infection, I feel that this needs to be thoroughly treated before we discuss surgery any further. Therefore, the patient and I agree that it would best to discuss this further in my office in 2-3 weeks, assuming his infection has cleared.  Thank you for asking me to participate in the care of this most pleasant man. I will be happy to keep you abreast of his progress.   Maryagnes Amos, MD  Beeper #:  [phone]  05/13/2016 5:34 PM

## 2016-05-14 ENCOUNTER — Encounter: Payer: Self-pay | Admitting: Radiology

## 2016-05-14 ENCOUNTER — Inpatient Hospital Stay: Payer: No Typology Code available for payment source

## 2016-05-14 LAB — CULTURE, BLOOD (ROUTINE X 2)

## 2016-05-14 LAB — CBC
HEMATOCRIT: 34.3 % — AB (ref 40.0–52.0)
Hemoglobin: 11.8 g/dL — ABNORMAL LOW (ref 13.0–18.0)
MCH: 30.1 pg (ref 26.0–34.0)
MCHC: 34.5 g/dL (ref 32.0–36.0)
MCV: 87.4 fL (ref 80.0–100.0)
PLATELETS: 148 10*3/uL — AB (ref 150–440)
RBC: 3.92 MIL/uL — AB (ref 4.40–5.90)
RDW: 13.3 % (ref 11.5–14.5)
WBC: 10.4 10*3/uL (ref 3.8–10.6)

## 2016-05-14 MED ORDER — DIATRIZOATE MEGLUMINE & SODIUM 66-10 % PO SOLN
15.0000 mL | ORAL | Status: AC
  Administered 2016-05-14: 15 mL via ORAL

## 2016-05-14 MED ORDER — IOPAMIDOL (ISOVUE-300) INJECTION 61%
100.0000 mL | Freq: Once | INTRAVENOUS | Status: AC | PRN
  Administered 2016-05-14: 100 mL via INTRAVENOUS

## 2016-05-14 MED ORDER — DIATRIZOATE MEGLUMINE & SODIUM 66-10 % PO SOLN
15.0000 mL | ORAL | Status: DC
Start: 2016-05-14 — End: 2016-05-14

## 2016-05-14 MED ORDER — CIPROFLOXACIN HCL 500 MG PO TABS: 500.0000 mg | ORAL_TABLET | Freq: Two times a day (BID) | ORAL | Status: DC

## 2016-05-14 NOTE — Discharge Summary (Signed)
Ronald Fitzgerald, is a 55 y.o. male  DOB 04-Nov-1960  MRN [id number].  Admission date:  05/11/2016  Admitting Physician  Wyatt Haste, MD  Discharge Date:  05/14/2016   Primary MD  No primary care provider on file.  Recommendations for primary care physician for things to follow:   Follow-up with Dr. Joice Lofts in 3 weeks   Admission Diagnosis  Pyelonephritis [N12] Sepsis, due to unspecified organism [facility]) [A41.9]   Discharge Diagnosis  Pyelonephritis [N12] Sepsis, due to unspecified organism [facility]) [A41.9]    Active Problems:   Sepsis (HCC)      History reviewed. No pertinent past medical history.  History reviewed. No pertinent past surgical history.     History of present illness and  Hospital Course:     Kindly see H&P for history of present illness and admission details, please review complete Labs, Consult reports and Test reports for all details in brief  HPI  from the history and physical done on the day of admission 55 year old male patient admitted for altered mental status found to have sepsis and UTI. Admitted to ICU.   Hospital Course  Sepsis secondary to UTI: Patient admitted to ICU, ; had very high fever up to 103 Fahrenheit. started on vancomycin and Zosyn. Patient received aggressive IV hydration. Blood cultures showed Escherichia coli, urine culture showed Escherichia coli. Patient antibiotics are not appropriate Rocephin 2 g IV daily. Repeat blood cultures did not show any growth. Patient WBC also is normalized today, WBC down from 22.5-10.4 today. Initial lactic acid 3.6 now down to 2. Continue Cipro 500 mg by mouth twice a day for 10 days discharge. Trying to get appointment with [facility] urology as a new patient for evaluation of prostate , possible BPH. She has been afebrile for last 24  hours.  #2. altered mental status secondary to sepsis: mental status is better right now. 3, abdominal distention: Abdominal CAT scan is ordered by night doctor.  If abdominal CAT scan is normal, patient can be discharged home.   Discharge Condition: stable   Follow UP  Follow-up Information    Follow up with Christena Flake, MD In 2 weeks.   Specialty:  Surgery   Contact information:   [address] [facility] [place] [address] [phone]       Follow up with [facility] In 2 weeks.        Discharge Instructions  and  Discharge Medications        Medication List    TAKE these medications        ciprofloxacin 500 MG tablet  Commonly known as:  CIPRO  Take 1 tablet (500 mg total) by mouth 2 (two) times daily.          Diet and Activity recommendation: See Discharge Instructions above   Consults obtained - ortho  Major procedures and Radiology Reports - PLEASE review detailed and final reports for all details, in brief -     Ct Head Wo Contrast  05/11/2016  CLINICAL DATA:  Patient found down today.  Altered mental status. EXAM: CT HEAD WITHOUT CONTRAST TECHNIQUE: Contiguous axial images were obtained from the base of the skull through the vertex without intravenous contrast. COMPARISON:  None. FINDINGS: The brain appears normal without hemorrhage, infarct, mass lesion, mass effect, midline shift or abnormal extra-axial fluid collection. No hydrocephalus or pneumocephalus. The calvarium is intact. Mild mucosal thickening is seen in the ethmoid air cells and right frontal sinus.  IMPRESSION: No acute abnormality. Mild ethmoid air cell disease. Electronically Signed   By: Drusilla Kanner M.D.   On: 05/11/2016 12:37   Mr Knee Left  Wo Contrast  05/12/2016  CLINICAL DATA:  Left knee pain. Possible injury running or hiking 6 months ago. Initial encounter. EXAM: MRI OF THE LEFT KNEE WITHOUT CONTRAST TECHNIQUE: Multiplanar, multisequence MR  imaging of the knee was performed. No intravenous contrast was administered. COMPARISON:  None. FINDINGS: MENISCI Medial meniscus:  Longitudinal tear is seen in the posterior horn. Lateral meniscus:  Intact. LIGAMENTS Cruciates:  Intact. Collaterals:  Intact. CARTILAGE Patellofemoral: A small focal fissure is seen in the far periphery of cartilage of the medial facet in the inferior pole. Medial: Small focus of hyaline cartilage loss is seen in the posterior medial femoral condyle with mild underlying subchondral edema. Lateral:  Unremarkable. Joint:  Trace amount of joint fluid. Popliteal Fossa:  No Baker's cyst. Extensor Mechanism:  Intact. Bones:  No fracture or worrisome marrow lesion. Other: None IMPRESSION: Longitudinal tear posterior horn medial meniscus. Mild medial and patellofemoral degenerative change for Electronically Signed   By: Drusilla Kanner M.D.   On: 05/12/2016 10:40   Dg Chest Port 1 View  05/11/2016  CLINICAL DATA:  Patient found down today. EXAM: PORTABLE CHEST 1 VIEW COMPARISON:  None. FINDINGS: The lungs are clear. Heart size is normal. No pneumothorax or pleural effusion. No bony abnormality. IMPRESSION: Negative chest. Electronically Signed   By: Drusilla Kanner M.D.   On: 05/11/2016 14:01    Micro Results     Recent Results (from the past 240 hour(s))  Urine culture     Status: Abnormal   Collection Time: 05/11/16 12:03 PM  Result Value Ref Range Status   Specimen Description URINE, CLEAN CATCH  Final   Special Requests NONE  Final   Culture >=100,000 COLONIES/mL ESCHERICHIA COLI (A)  Final   Report Status 05/13/2016 FINAL  Final   Organism ID, Bacteria ESCHERICHIA COLI (A)  Final      Susceptibility   Escherichia coli - MIC*    AMPICILLIN <=2 SENSITIVE Sensitive     CEFAZOLIN <=4 SENSITIVE Sensitive     CEFTRIAXONE <=1 SENSITIVE Sensitive     CIPROFLOXACIN <=0.25 SENSITIVE Sensitive     GENTAMICIN <=1 SENSITIVE Sensitive     IMIPENEM <=0.25 SENSITIVE Sensitive      NITROFURANTOIN <=16 SENSITIVE Sensitive     TRIMETH/SULFA <=20 SENSITIVE Sensitive     AMPICILLIN/SULBACTAM <=2 SENSITIVE Sensitive     PIP/TAZO <=4 SENSITIVE Sensitive     Extended ESBL NEGATIVE Sensitive     * >=100,000 COLONIES/mL ESCHERICHIA COLI  Culture, blood (routine x 2)     Status: Abnormal   Collection Time: 05/11/16 12:15 PM  Result Value Ref Range Status   Specimen Description   Final    BLOOD LEFT ANTECUBITAL Performed at [facility]    Special Requests BOTTLES DRAWN AEROBIC AND ANAEROBIC 5 CC  Final   Culture  Setup Time   Final    GRAM NEGATIVE RODS AEROBIC BOTTLE ONLY CRITICAL RESULT CALLED TO, READ BACK BY AND VERIFIED WITH: CHRISTY KATSOUDAS ON 05/12/16 AT 6045 QSD    Culture ESCHERICHIA COLI (A)  Final   Report Status 05/14/2016 FINAL  Final   Organism ID, Bacteria ESCHERICHIA COLI  Final      Susceptibility   Escherichia coli - MIC*    AMPICILLIN <=2 SENSITIVE Sensitive     CEFAZOLIN <=4 SENSITIVE Sensitive  CEFEPIME <=1 SENSITIVE Sensitive     CEFTAZIDIME <=1 SENSITIVE Sensitive     CEFTRIAXONE <=1 SENSITIVE Sensitive     CIPROFLOXACIN <=0.25 SENSITIVE Sensitive     GENTAMICIN <=1 SENSITIVE Sensitive     IMIPENEM <=0.25 SENSITIVE Sensitive     TRIMETH/SULFA <=20 SENSITIVE Sensitive     AMPICILLIN/SULBACTAM <=2 SENSITIVE Sensitive     PIP/TAZO <=4 SENSITIVE Sensitive     Extended ESBL NEGATIVE Sensitive     * ESCHERICHIA COLI  Culture, blood (routine x 2)     Status: None (Preliminary result)   Collection Time: 05/11/16 12:15 PM  Result Value Ref Range Status   Specimen Description BLOOD LEFT ARM  Final   Special Requests BOTTLES DRAWN AEROBIC AND ANAEROBIC 5 CC  Final   Culture NO GROWTH 3 DAYS  Final   Report Status PENDING  Incomplete  Blood Culture ID Panel (Reflexed)     Status: Abnormal   Collection Time: 05/11/16 12:15 PM  Result Value Ref Range Status   Enterococcus species NOT DETECTED NOT DETECTED Final   Vancomycin  resistance NOT DETECTED NOT DETECTED Final   Listeria monocytogenes NOT DETECTED NOT DETECTED Final   Staphylococcus species NOT DETECTED NOT DETECTED Final   Staphylococcus aureus NOT DETECTED NOT DETECTED Final   Methicillin resistance NOT DETECTED NOT DETECTED Final   Streptococcus species NOT DETECTED NOT DETECTED Final   Streptococcus agalactiae NOT DETECTED NOT DETECTED Final   Streptococcus pneumoniae NOT DETECTED NOT DETECTED Final   Streptococcus pyogenes NOT DETECTED NOT DETECTED Final   Acinetobacter baumannii NOT DETECTED NOT DETECTED Final   Enterobacteriaceae species NOT DETECTED NOT DETECTED Final   Enterobacter cloacae complex NOT DETECTED NOT DETECTED Final   Escherichia coli DETECTED (A) NOT DETECTED Final    Comment: CRITICAL RESULT CALLED TO, READ BACK BY AND VERIFIED WITH: CHRISTY KATSOUDAS ON 05/12/16 AT 0733 QSD    Klebsiella oxytoca NOT DETECTED NOT DETECTED Final   Klebsiella pneumoniae NOT DETECTED NOT DETECTED Final   Proteus species NOT DETECTED NOT DETECTED Final   Serratia marcescens NOT DETECTED NOT DETECTED Final   Carbapenem resistance NOT DETECTED NOT DETECTED Final   Haemophilus influenzae NOT DETECTED NOT DETECTED Final   Neisseria meningitidis NOT DETECTED NOT DETECTED Final   Pseudomonas aeruginosa NOT DETECTED NOT DETECTED Final   Candida albicans NOT DETECTED NOT DETECTED Final   Candida glabrata NOT DETECTED NOT DETECTED Final   Candida krusei NOT DETECTED NOT DETECTED Final   Candida parapsilosis NOT DETECTED NOT DETECTED Final   Candida tropicalis NOT DETECTED NOT DETECTED Final  MRSA PCR Screening     Status: None   Collection Time: 05/11/16  4:42 PM  Result Value Ref Range Status   MRSA by PCR NEGATIVE NEGATIVE Final    Comment:        The GeneXpert MRSA Assay (FDA approved for NASAL specimens only), is one component of a comprehensive MRSA colonization surveillance program. It is not intended to diagnose MRSA infection nor to guide  or monitor treatment for MRSA infections.   C difficile quick scan w PCR reflex     Status: None   Collection Time: 05/13/16  7:58 AM  Result Value Ref Range Status   C Diff antigen NEGATIVE NEGATIVE Final   C Diff toxin NEGATIVE NEGATIVE Final   C Diff interpretation No C. difficile detected.  Final  CULTURE, BLOOD (ROUTINE X 2) w Reflex to ID Panel     Status: None (Preliminary result)   Collection Time:  05/13/16 10:55 AM  Result Value Ref Range Status   Specimen Description BLOOD LEFT HAND  Final   Special Requests NONE  Final   Culture NO GROWTH < 24 HOURS  Final   Report Status PENDING  Incomplete  CULTURE, BLOOD (ROUTINE X 2) w Reflex to ID Panel     Status: None (Preliminary result)   Collection Time: 05/13/16 11:09 AM  Result Value Ref Range Status   Specimen Description BLOOD LEFT ARM  Final   Special Requests AER 5CC ANA 5CC  Final   Culture NO GROWTH < 24 HOURS  Final   Report Status PENDING  Incomplete       Today   Subjective:   Ronald Fitzgerald today has no headache,no chest abdominal pain,no new weakness tingling or numbness, feels much better wants to go home today  Objective:   Blood pressure 124/74, pulse 66, temperature 98.5 F (36.9 C), temperature source Oral, resp. rate 18, height 5\' 10"  (1.778 m), weight 75 kg (165 lb 5.5 oz), SpO2 99 %.   Intake/Output Summary (Last 24 hours) at 05/14/16 1135 Last data filed at 05/14/16 0900  Gross per 24 hour  Intake   3152 ml  Output   1050 ml  Net   2102 ml    Exam Awake Alert, Oriented x 3, No new F.N deficits, Normal affect [place].AT,PERRAL Supple Neck,No JVD, No cervical lymphadenopathy appriciated.  Symmetrical Chest wall movement, Good air movement bilaterally, CTAB RRR,No Gallops,Rubs or new Murmurs, No Parasternal Heave +ve B.Sounds, Abd Soft, Non tender, No organomegaly appriciated, No rebound -guarding or rigidity. No Cyanosis, Clubbing or edema, No new Rash or bruise  Data Review   CBC w Diff:  Lab Results  Component Value Date   WBC 10.4 05/14/2016   HGB 11.8* 05/14/2016   HCT 34.3* 05/14/2016   PLT 148* 05/14/2016   LYMPHOPCT 6 05/11/2016   MONOPCT 1 05/11/2016   EOSPCT 0 05/11/2016   BASOPCT 0 05/11/2016    CMP: Lab Results  Component Value Date   NA 137 05/12/2016   K 3.4* 05/12/2016   CL 110 05/12/2016   CO2 23 05/12/2016   BUN 14 05/12/2016   CREATININE 0.96 05/12/2016   PROT 7.3 05/11/2016   ALBUMIN 4.4 05/11/2016   BILITOT 1.3* 05/11/2016   ALKPHOS 60 05/11/2016   AST 26 05/11/2016   ALT 16* 05/11/2016  .   Total Time in preparing paper work, data evaluation and todays exam - 35 minutes  Kyana Aicher M.D on 05/14/2016 at 11:35 AM    Note: This dictation was prepared with Dragon dictation along with smaller phrase technology. Any transcriptional errors that result from this process are unintentional.

## 2016-05-14 NOTE — Progress Notes (Signed)
Pt to be discharged per MD order. IV removed. Instructions reviewed with pt and family and all questions answered. Follow up appointments made. Scripts given to pt

## 2016-05-16 LAB — CULTURE, BLOOD (ROUTINE X 2): Culture: NO GROWTH

## 2016-05-18 LAB — CULTURE, BLOOD (ROUTINE X 2)
CULTURE: NO GROWTH
CULTURE: NO GROWTH

## 2016-06-03 ENCOUNTER — Ambulatory Visit (INDEPENDENT_AMBULATORY_CARE_PROVIDER_SITE_OTHER): Payer: No Typology Code available for payment source | Admitting: Urology

## 2016-06-03 ENCOUNTER — Encounter: Payer: Self-pay | Admitting: Urology

## 2016-06-03 VITALS — BP 113/71 | HR 79 | Ht 70.0 in | Wt 158.0 lb

## 2016-06-03 DIAGNOSIS — N4289 Other specified disorders of prostate: Secondary | ICD-10-CM | POA: Diagnosis not present

## 2016-06-03 DIAGNOSIS — N401 Enlarged prostate with lower urinary tract symptoms: Secondary | ICD-10-CM | POA: Diagnosis not present

## 2016-06-03 DIAGNOSIS — Z8619 Personal history of other infectious and parasitic diseases: Secondary | ICD-10-CM | POA: Diagnosis not present

## 2016-06-03 DIAGNOSIS — R399 Unspecified symptoms and signs involving the genitourinary system: Secondary | ICD-10-CM

## 2016-06-03 DIAGNOSIS — N138 Other obstructive and reflux uropathy: Secondary | ICD-10-CM

## 2016-06-03 LAB — BLADDER SCAN AMB NON-IMAGING: SCAN RESULT: 72

## 2016-06-03 NOTE — Progress Notes (Signed)
06/03/2016 9:24 AM   Ronald Fitzgerald 26-Oct-1961 [id number]  Referring provider: No referring provider defined for this encounter.  Chief Complaint  Patient presents with  . Benign Prostatic Hypertrophy    self refferral new patient    HPI: Patient is a 55 year old Caucasian male who was recently admitted for E.coli sepsis who present today for further evaluation and management.    Patient had the sudden onset of dysuria with the onset of a high fever (104 F).  He presented to [facility] for further evaluation.  He then started to have an altered mental status.   He was sent to the ED and ultimately admitted to the ICU for sepsis.    CT performed noted mild ascites, fatty liver, bilateral pleural effusions and basilar atelectasis, decompressed gallbladder with wall thickening and mottled enhancement of the prostate of uncertain significance.   His IPSS score today is 14, which is moderate lower urinary tract symptomatology. He is mixed with his quality life due to his urinary symptoms. His PVR is 72 mL.  His major complaint today frequency and nocturia x 4.  He has had these symptoms for several years.  He denies any dysuria, hematuria or suprapubic pain.  He also denies any recent fevers, chills, nausea or vomiting.  He does not have a family history of PCa.      IPSS    Row Name 06/03/16 0800         International Prostate Symptom Score   How often have you had the sensation of not emptying your bladder? About half the time     How often have you had to urinate less than every two hours? Almost always     How often have you found you stopped and started again several times when you urinated? Less than 1 in 5 times     How often have you found it difficult to postpone urination? Not at All     How often have you had a weak urinary stream? Less than 1 in 5 times     How often have you had to strain to start urination? Not at All     How many times did you typically get  up at night to urinate? 4 Times     Total IPSS Score 14       Quality of Life due to urinary symptoms   If you were to spend the rest of your life with your urinary condition just the way it is now how would you feel about that? Mixed        Score:  1-7 Mild 8-19 Moderate 20-35 Severe  Patient states he has a very active sex life at this time.  He has a new girlfriend and he is having sex several times a day on the weekend.  He states his sex life is the best it has ever been at this time.     PMH: Past Medical History:  Diagnosis Date  . BPH (benign prostatic hyperplasia)   . Hepatitis     Surgical History: No past surgical history on file.  Home Medications:    Medication List       Accurate as of 06/03/16  9:24 AM. Always use your most recent med list.          ciprofloxacin 500 MG tablet Commonly known as:  CIPRO Take 1 tablet (500 mg total) by mouth 2 (two) times daily.  Allergies: No Known Allergies  Family History: Family History  Problem Relation Age of Onset  . Prostate cancer Neg Hx   . Kidney disease Neg Hx     Social History:  reports that he has quit smoking. He has never used smokeless tobacco. He reports that he does not drink alcohol or use drugs.  ROS: UROLOGY Frequent Urination?: Yes Hard to postpone urination?: No Burning/pain with urination?: No Get up at night to urinate?: Yes Leakage of urine?: No Urine stream starts and stops?: No Trouble starting stream?: No Do you have to strain to urinate?: No Blood in urine?: No Urinary tract infection?: No Sexually transmitted disease?: No Injury to kidneys or bladder?: No Painful intercourse?: No Weak stream?: No Erection problems?: No Penile pain?: No  Gastrointestinal Nausea?: No Vomiting?: No Indigestion/heartburn?: No Diarrhea?: No Constipation?: No  Constitutional Fever: No Night sweats?: No Weight loss?: No Fatigue?: No  Skin Skin rash/lesions?: No Itching?:  No  Eyes Blurred vision?: No Double vision?: No  Ears/Nose/Throat Sore throat?: No Sinus problems?: No  Hematologic/Lymphatic Swollen glands?: No Easy bruising?: No  Cardiovascular Leg swelling?: No Chest pain?: No  Respiratory Cough?: No Shortness of breath?: No  Endocrine Excessive thirst?: No  Musculoskeletal Back pain?: No Joint pain?: No  Neurological Headaches?: No Dizziness?: No  Psychologic Depression?: No Anxiety?: No  Physical Exam: BP 113/71   Pulse 79   Ht  (1.778 m)   Wt 158 lb (71.7 kg)   BMI 22.67 kg/m   Constitutional: Well nourished. Alert and oriented, No acute distress. HEENT: [place] AT, moist mucus membranes. Trachea midline, no masses. Cardiovascular: No clubbing, cyanosis, or edema. Respiratory: Normal respiratory effort, no increased work of breathing. GI: Abdomen is soft, non tender, non distended, no abdominal masses. Liver and spleen not palpable.  No hernias appreciated.  Stool sample for occult testing is not indicated.   GU: No CVA tenderness.  No bladder fullness or masses.  Patient with circumcised phallus.  Urethral meatus is patent.  No penile discharge. No penile lesions or rashes. Scrotum without lesions, cysts, rashes and/or edema.  Testicles are located scrotally bilaterally. No masses are appreciated in the testicles. Left and right epididymis are normal. Rectal: Patient with  normal sphincter tone. Anus and perineum without scarring or rashes. No rectal masses are appreciated. Prostate is approximately 60 grams, no nodules are appreciated. Seminal vesicles are normal. Skin: No rashes, bruises or suspicious lesions. Lymph: No cervical or inguinal adenopathy. Neurologic: Grossly intact, no focal deficits, moving all 4 extremities. Psychiatric: Normal mood and affect.  Laboratory Data: Lab Results  Component Value Date   WBC 10.4 05/14/2016   HGB 11.8 (L) 05/14/2016   HCT 34.3 (L) 05/14/2016   MCV 87.4 05/14/2016    PLT 148 (L) 05/14/2016    Lab Results  Component Value Date   CREATININE 0.96 05/12/2016    Lab Results  Component Value Date   AST 26 05/11/2016   Lab Results  Component Value Date   ALT 16 (L) 05/11/2016    Pertinent Imaging: Results for Ronald Fitzgerald (MRN [id number]) as of 06/03/2016 09:15  Ref. Range 06/03/2016 09:14  Scan Result Unknown 72    Assessment & Plan:    1. BPH with LUTS  - IPSS score is 14/3   - Continue conservative management, avoiding bladder irritants and timed voiding's  - Homero Fellers conversation with the patient concerning his sex life and the impact of BPH and possible treatments on his sex life -  PSA  - Follow up pending PSA results and his decision concerning his future treatment after he has a discussion with his                 Girlfriend  2. History of sepsis  - patient without symptoms at this time  - patient to contact the office if he has fever, chills, or cannot urinate  3. Mottled prostate  - PSA drawn today   Return for pending PSA .  These notes generated with voice recognition software. I apologize for typographical errors.  Michiel Cowboy, PA-C  [facility] [address] [place], [place] [address] [phone]

## 2016-06-04 ENCOUNTER — Telehealth: Payer: Self-pay

## 2016-06-04 DIAGNOSIS — N400 Enlarged prostate without lower urinary tract symptoms: Secondary | ICD-10-CM

## 2016-06-04 LAB — PSA: Prostate Specific Ag, Serum: 11.4 ng/mL — ABNORMAL HIGH (ref 0.0–4.0)

## 2016-06-04 NOTE — Telephone Encounter (Signed)
Spoke with pt in reference to PSA results. Pt voiced understanding. Lab appt made. Orders placed.  

## 2016-06-04 NOTE — Telephone Encounter (Signed)
-----   Message from Harle Battiest, PA-C sent at 06/04/2016  8:21 AM EDT ----- Please notify the patient that his PSA is elevated.  This may be due to the prostate still recovering from the episode of sepsis.  I suggest repeating the PSA in one month to ensure stability.

## 2016-06-21 ENCOUNTER — Encounter
Admission: RE | Admit: 2016-06-21 | Discharge: 2016-06-21 | Disposition: A | Discharge status: Home / Self Care | Payer: PRIVATE HEALTH INSURANCE | Source: Ambulatory Visit | Attending: Surgery | Admitting: Surgery

## 2016-06-21 HISTORY — DX: Headache, unspecified: R51.9

## 2016-06-21 HISTORY — DX: Sepsis, unspecified organism: A41.9

## 2016-06-21 HISTORY — DX: Headache: R51

## 2016-06-21 NOTE — Patient Instructions (Signed)
  Your procedure is scheduled on: 07-01-16 Report to Same Day Surgery 2nd floor medical mall To find out your arrival time please call [phone] between 1PM - 3PM on 06-30-16  Remember: Instructions that are not followed completely may result in serious medical risk, up to and including death, or upon the discretion of your surgeon and anesthesiologist your surgery may need to be rescheduled.    _x___ 1. Do not eat food or drink liquids after midnight. No gum chewing or hard candies.     __x__ 2. No Alcohol for 24 hours before or after surgery.   __x__3. No Smoking for 24 prior to surgery.   ____  4. Bring all medications with you on the day of surgery if instructed.    __x__ 5. Notify your doctor if there is any change in your medical condition     (cold, fever, infections).     Do not wear jewelry, make-up, hairpins, clips or nail polish.  Do not wear lotions, powders, or perfumes. You may wear deodorant.  Do not shave 48 hours prior to surgery. Men may shave face and neck.  Do not bring valuables to the hospital.    [facility] is not responsible for any belongings or valuables.               Contacts, dentures or bridgework may not be worn into surgery.  Leave your suitcase in the car. After surgery it may be brought to your room.  For patients admitted to the hospital, discharge time is determined by your treatment team.   Patients discharged the day of surgery will not be allowed to drive home.    Please read over the following fact sheets that you were given:   [facility] Preparing for Surgery and or MRSA Information   ____ Take these medicines the morning of surgery with A SIP OF WATER:    1. NONE  2.  3.  4.  5.  6.  ____ Fleet Enema (as directed)   ____ Use CHG Soap or sage wipes as directed on instruction sheet   ____ Use inhalers on the day of surgery and bring to hospital day of surgery  ____ Stop metformin 2 days prior to surgery    ____ Take 1/2 of  usual insulin dose the night before surgery and none on the morning of surgery.   ____ Stop aspirin or coumadin, or plavix  _x__ Stop Anti-inflammatories such as Advil, Aleve, Ibuprofen, Motrin, Naproxen,          Naprosyn, Goodies powders or aspirin products 7 DAYS PRIOR TO SURGERY-Ok to take Tylenol.   _X___ Stop supplements until after surgery-STOP SAW PALMETTO AND PROBIOTIC NOW  ____ Bring C-Pap to the hospital.

## 2016-06-30 ENCOUNTER — Encounter: Payer: Self-pay | Admitting: *Deleted

## 2016-06-30 NOTE — Pre-Procedure Instructions (Signed)
ED Provider Notes Date of Service: 05/11/2016 12:00 PM Governor [name], MD  Emergency Medicine    [facility] Emergency Department Provider Note   ____________________________________________  Time seen: Approximately 1145am I have reviewed the triage vital signs and the triage nursing note.  HISTORY  Chief Complaint Altered Mental Status and Tachycardia   Historian Limited: Patient with altered mental status  HPI Ronald Fitzgerald is a 55 y.o. male with only past medical history, found with altered mental status outside [facility]. He's found to be tachycardic and states that his back hurts, unclear if there is any trauma, however does not appear to have any evidence of trauma externally.  Patient is extremely limited historian given his altered mental status, states that he did not drive here then stated he did drive here. States he has back pain and states he does not have back pain. He states he does not have chest pain or trouble breathing.    History reviewed. No pertinent past medical history. Unknown      Patient Active Problem List   Diagnosis Date Noted  . Sepsis (HCC) 05/11/2016    History reviewed. No pertinent past surgical history. Unknown  No current outpatient prescriptions on file.  Allergies Review of patient's allergies indicates no known allergies.  History reviewed. No pertinent family history.  Social History     Social History  Substance Use Topics  . Smoking status: Former Games developermoker  . Smokeless tobacco: None  . Alcohol Use: No    Review of Systems  Limited due to altered mental status.  Denies chest pain or cough or trouble breathing.  States that he had a fever, sound like he was subjective. Positive for low back pain and dysuria. Denies abdominal pain or vomiting. ____________________________________________   PHYSICAL EXAM:  VITAL SIGNS:     ED Triage Vitals  Enc Vitals Group     BP --      Pulse --      Resp --      Temp --      Temp src --      SpO2 --      Weight --      Height --      Head Cir --      Peak Flow --      Pain Score --      Pain Loc --      Pain Edu? --      Excl. in GC? --      Constitutional: Opens his eyes to voice and states his name, otherwise history is inconsistent. No acute distress. HEENT   Head: Normocephalic and atraumatic.      Eyes: Conjunctivae are normal. PERRL. Normal extraocular movements.      Ears:         Nose: No congestion/rhinnorhea.   Mouth/Throat: Mucous membranes are moist.   Neck: No stridor. No C-spine tenderness. Cardiovascular/Chest: Tachycardic and regular.  No murmurs, rubs, or gallops. Respiratory: Normal respiratory effort without tachypnea nor retractions. Breath sounds are clear and equal bilaterally. No wheezes/rales/rhonchi. Gastrointestinal: Soft. No distention, no guarding, no rebound. Nontender.    Genitourinary/rectal:Deferred Musculoskeletal: Nontender with normal range of motion in all extremities. No joint effusions.  No lower extremity tenderness.  No edema. Neurologic:  No slurred speech or facial droop. No gross or focal neurologic deficits are appreciated. Skin:  Skin is warm, dry and intact. No rash noted.  ____________________________________________   EKG I, Governor [name], MD, the  attending physician have personally viewed and interpreted all ECGs.  129 bpm. Sinus tachycardia. Narrow QRS. Right axis. Normal ST and T-wave ____________________________________________  LABS (pertinent positives/negatives)       Labs Reviewed  URINALYSIS COMPLETEWITH MICROSCOPIC ([facility] ONLY) - Abnormal; Notable for the following:    Color, Urine YELLOW (*)    APPearance HAZY (*)    Ketones, ur TRACE (*)    pH 9.0 (*)    Protein, ur 100 (*)    Leukocytes, UA 2+ (*)    All other components within normal limits  COMPREHENSIVE METABOLIC PANEL -  Abnormal; Notable for the following:    Glucose, Bld 126 (*)    ALT 16 (*)    Total Bilirubin 1.3 (*)    All other components within normal limits  LACTIC ACID, PLASMA - Abnormal; Notable for the following:    Lactic Acid, Venous 3.6 (*)    All other components within normal limits  CBC WITH DIFFERENTIAL/PLATELET - Abnormal; Notable for the following:    WBC 16.3 (*)    Neutro Abs 15.2 (*)    Lymphs Abs 0.9 (*)    All other components within normal limits  GLUCOSE, CAPILLARY - Abnormal; Notable for the following:    Glucose-Capillary 110 (*)    All other components within normal limits  URINE CULTURE  CULTURE, BLOOD (ROUTINE X 2)  CULTURE, BLOOD (ROUTINE X 2)  URINE DRUG SCREEN, QUALITATIVE ([facility] ONLY)  ETHANOL  TROPONIN I  LACTIC ACID, PLASMA    ____________________________________________  RADIOLOGY All Xrays were viewed by me. Imaging interpreted by Radiologist.  CT head: IMPRESSION: No acute abnormality.  Mild ethmoid air cell disease.  Chest x-ray:  Negative chest __________________________________________  PROCEDURES  Procedure(s) performed: None  Critical Care performed: CRITICAL CARE Performed by: Governor Rooks   Total critical care time: 30 minutes  Critical care time was exclusive of separately billable procedures and treating other patients.  Critical care was necessary to treat or prevent imminent or life-threatening deterioration.  Critical care was time spent personally by me on the following activities: development of treatment plan with patient and/or surrogate as well as nursing, discussions with consultants, evaluation of patient's response to treatment, examination of patient, obtaining history from patient or surrogate, ordering and performing treatments and interventions, ordering and review of laboratory studies, ordering and review of radiographic studies, pulse oximetry and re-evaluation of patient's  condition.   ____________________________________________   ED COURSE / ASSESSMENT AND PLAN  Pertinent labs & imaging results that were available during my care of the patient were reviewed by me and considered in my medical decision making (see chart for details).   This patient arrived with altered mental status and found to be tachycardic and febrile concerning for sepsis. Also due to altered mental status, and being found outside unattended I am going to CT his head.   IV fluids and antibiotics were initiated based on undifferentiated sepsis in order to not delay treatment.  Laboratory studies show white blood cell count elevated, lactate 3.6 and I did initiate a 30 cc/kg IV fluid bolus. His urinalysis looks to be consistent with urinary tract infection and I will treat for pyelonephritis. Given his altered mental status and evidence of sepsis with elevated white blood cell count and high lactate, patient will be admitted to the hospitalist for further treatment.    CONSULTATIONS: Hospitalist for admission.   Patient / Family / Caregiver informed of clinical course, medical decision-making process, and agree with plan.  ___________________________________________   FINAL CLINICAL IMPRESSION(S) / ED DIAGNOSES   Final diagnoses:  Sepsis, due to unspecified organism [facility])  Pyelonephritis              Note: This dictation was prepared with Dragon dictation. Any transcriptional errors that result from this process are unintentional   Governor Rooks, MD 05/11/16 1459       ED to Hosp-Admission (Discharged) on 05/11/2016        Detailed Report

## 2016-06-30 NOTE — Pre-Procedure Instructions (Signed)
Discharge Summaries Date of Service: 05/14/2016 11:34 AM Katha HammingSnehalatha Konidena, MD  Internal Medicine  Expand All Collapse All                                                                                                                                                                        Ronald BailiffWilliam R Reddix, is a 55 y.o. male  DOB 1961-08-19  MRN [id number].  Admission date:  05/11/2016  Admitting Physician  Wyatt Hasteavid K Hower, MD  Discharge Date:  05/14/2016   Primary MD  No primary care provider on file.  Recommendations for primary care physician for things to follow:   Follow-up with Dr. Joice LoftsPoggi in 3 weeks   Admission Diagnosis  Pyelonephritis [N12] Sepsis, due to unspecified organism [facility](HCC) [A41.9]   Discharge Diagnosis  Pyelonephritis [N12] Sepsis, due to unspecified organism [facility](HCC) [A41.9]    Active Problems:   Sepsis (HCC)      History reviewed. No pertinent past medical history.  History reviewed. No pertinent past surgical history.     History of present illness and  Hospital Course:     Kindly see H&P for history of present illness and admission details, please review complete Labs, Consult reports and Test reports for all details in brief  HPI  from the history and physical done on the day of admission 55 year old male patient admitted for altered mental status found to have sepsis and UTI. Admitted to ICU.   Hospital Course  Sepsis secondary to UTI: Patient admitted to ICU, ; had very high fever up to 103 Fahrenheit. started on vancomycin and Zosyn. Patient received aggressive IV hydration. Blood cultures showed Escherichia coli, urine culture showed Escherichia coli. Patient antibiotics are not appropriate Rocephin 2 g IV daily. Repeat blood cultures did not show any growth. Patient WBC also is normalized today, WBC down from 22.5-10.4 today. Initial lactic acid 3.6 now down to 2. Continue Cipro 500 mg by mouth twice a day for 10 days discharge.  [facility] as a new patient for evaluation of prostate , possible BPH. She has been afebrile for last 24 hours.  #2. altered mental status secondary to sepsis: mental status is better right now. 3, abdominal distention: Abdominal CAT scan is ordered by night doctor.  If abdominal CAT scan is normal, patient can be discharged home.   Discharge Condition: stable   Follow UP     Follow-up Information    Follow up with Christena FlakeJohn J Poggi, MD In 2 weeks.   Specialty:  Surgery   Contact information:   [address] [facility] [place] [id number] [phone]      Follow up with [facility] In 2 weeks.  Discharge Instructions  and  Discharge Medications        Medication List    TAKE these medications        ciprofloxacin 500 MG tablet  Commonly known as:  CIPRO  Take 1 tablet (500 mg total) by mouth 2 (two) times daily.          Diet and Activity recommendation: See Discharge Instructions above   Consults obtained - ortho  Major procedures and Radiology Reports - PLEASE review detailed and final reports for all details, in brief -      Imaging Results  Ct Head Wo Contrast  05/11/2016  CLINICAL DATA:  Patient found down today.  Altered mental status. EXAM: CT HEAD WITHOUT CONTRAST TECHNIQUE: Contiguous axial images were obtained from the base of the skull through the vertex without intravenous contrast. COMPARISON:  None. FINDINGS: The brain appears normal without hemorrhage, infarct, mass lesion, mass effect, midline shift or abnormal extra-axial fluid collection. No hydrocephalus or pneumocephalus. The calvarium is intact. Mild mucosal thickening is seen in the ethmoid air cells and right frontal sinus. IMPRESSION: No acute abnormality. Mild ethmoid air cell disease. Electronically Signed   By: Drusilla Kanner M.D.   On: 05/11/2016 12:37   Mr Knee Left  Wo  Contrast  05/12/2016  CLINICAL DATA:  Left knee pain. Possible injury running or hiking 6 months ago. Initial encounter. EXAM: MRI OF THE LEFT KNEE WITHOUT CONTRAST TECHNIQUE: Multiplanar, multisequence MR imaging of the knee was performed. No intravenous contrast was administered. COMPARISON:  None. FINDINGS: MENISCI Medial meniscus:  Longitudinal tear is seen in the posterior horn. Lateral meniscus:  Intact. LIGAMENTS Cruciates:  Intact. Collaterals:  Intact. CARTILAGE Patellofemoral: A small focal fissure is seen in the far periphery of cartilage of the medial facet in the inferior pole. Medial: Small focus of hyaline cartilage loss is seen in the posterior medial femoral condyle with mild underlying subchondral edema. Lateral:  Unremarkable. Joint:  Trace amount of joint fluid. Popliteal Fossa:  No Baker's cyst. Extensor Mechanism:  Intact. Bones:  No fracture or worrisome marrow lesion. Other: None IMPRESSION: Longitudinal tear posterior horn medial meniscus. Mild medial and patellofemoral degenerative change for Electronically Signed   By: Drusilla Kanner M.D.   On: 05/12/2016 10:40   Dg Chest Port 1 View  05/11/2016  CLINICAL DATA:  Patient found down today. EXAM: PORTABLE CHEST 1 VIEW COMPARISON:  None. FINDINGS: The lungs are clear. Heart size is normal. No pneumothorax or pleural effusion. No bony abnormality. IMPRESSION: Negative chest. Electronically Signed   By: Drusilla Kanner M.D.   On: 05/11/2016 14:01     Micro Results            Recent Results (from the past 240 hour(s))  Urine culture     Status: Abnormal   Collection Time: 05/11/16 12:03 PM  Result Value Ref Range Status   Specimen Description URINE, CLEAN CATCH  Final   Special Requests NONE  Final   Culture >=100,000 COLONIES/mL ESCHERICHIA COLI (A)  Final   Report Status 05/13/2016 FINAL  Final   Organism ID, Bacteria ESCHERICHIA COLI (A)  Final      Susceptibility   Escherichia coli - MIC*     AMPICILLIN <=2 SENSITIVE Sensitive     CEFAZOLIN <=4 SENSITIVE Sensitive     CEFTRIAXONE <=1 SENSITIVE Sensitive     CIPROFLOXACIN <=0.25 SENSITIVE Sensitive     GENTAMICIN <=1 SENSITIVE Sensitive     IMIPENEM <=0.25 SENSITIVE Sensitive  NITROFURANTOIN <=16 SENSITIVE Sensitive     TRIMETH/SULFA <=20 SENSITIVE Sensitive     AMPICILLIN/SULBACTAM <=2 SENSITIVE Sensitive     PIP/TAZO <=4 SENSITIVE Sensitive     Extended ESBL NEGATIVE Sensitive     * >=100,000 COLONIES/mL ESCHERICHIA COLI  Culture, blood (routine x 2)     Status: Abnormal   Collection Time: 05/11/16 12:15 PM  Result Value Ref Range Status   Specimen Description   Final    BLOOD LEFT ANTECUBITAL Performed at [facility]   Special Requests BOTTLES DRAWN AEROBIC AND ANAEROBIC 5 CC  Final   Culture  Setup Time   Final    GRAM NEGATIVE RODS AEROBIC BOTTLE ONLY CRITICAL RESULT CALLED TO, READ BACK BY AND VERIFIED WITH: CHRISTY KATSOUDAS ON 05/12/16 AT 1610 QSD   Culture ESCHERICHIA COLI (A)  Final   Report Status 05/14/2016 FINAL  Final   Organism ID, Bacteria ESCHERICHIA COLI  Final      Susceptibility   Escherichia coli - MIC*    AMPICILLIN <=2 SENSITIVE Sensitive     CEFAZOLIN <=4 SENSITIVE Sensitive     CEFEPIME <=1 SENSITIVE Sensitive     CEFTAZIDIME <=1 SENSITIVE Sensitive     CEFTRIAXONE <=1 SENSITIVE Sensitive     CIPROFLOXACIN <=0.25 SENSITIVE Sensitive     GENTAMICIN <=1 SENSITIVE Sensitive     IMIPENEM <=0.25 SENSITIVE Sensitive     TRIMETH/SULFA <=20 SENSITIVE Sensitive     AMPICILLIN/SULBACTAM <=2 SENSITIVE Sensitive     PIP/TAZO <=4 SENSITIVE Sensitive     Extended ESBL NEGATIVE Sensitive     * ESCHERICHIA COLI  Culture, blood (routine x 2)     Status: None (Preliminary result)   Collection Time: 05/11/16 12:15 PM  Result Value Ref Range Status   Specimen Description BLOOD LEFT ARM  Final   Special  Requests BOTTLES DRAWN AEROBIC AND ANAEROBIC 5 CC  Final   Culture NO GROWTH 3 DAYS  Final   Report Status PENDING  Incomplete  Blood Culture ID Panel (Reflexed)     Status: Abnormal   Collection Time: 05/11/16 12:15 PM  Result Value Ref Range Status   Enterococcus species NOT DETECTED NOT DETECTED Final   Vancomycin resistance NOT DETECTED NOT DETECTED Final   Listeria monocytogenes NOT DETECTED NOT DETECTED Final   Staphylococcus species NOT DETECTED NOT DETECTED Final   Staphylococcus aureus NOT DETECTED NOT DETECTED Final   Methicillin resistance NOT DETECTED NOT DETECTED Final   Streptococcus species NOT DETECTED NOT DETECTED Final   Streptococcus agalactiae NOT DETECTED NOT DETECTED Final   Streptococcus pneumoniae NOT DETECTED NOT DETECTED Final   Streptococcus pyogenes NOT DETECTED NOT DETECTED Final   Acinetobacter baumannii NOT DETECTED NOT DETECTED Final   Enterobacteriaceae species NOT DETECTED NOT DETECTED Final   Enterobacter cloacae complex NOT DETECTED NOT DETECTED Final   Escherichia coli DETECTED (A) NOT DETECTED Final    Comment: CRITICAL RESULT CALLED TO, READ BACK BY AND VERIFIED WITH: CHRISTY KATSOUDAS ON 05/12/16 AT 0733 QSD   Klebsiella oxytoca NOT DETECTED NOT DETECTED Final   Klebsiella pneumoniae NOT DETECTED NOT DETECTED Final   Proteus species NOT DETECTED NOT DETECTED Final   Serratia marcescens NOT DETECTED NOT DETECTED Final   Carbapenem resistance NOT DETECTED NOT DETECTED Final   Haemophilus influenzae NOT DETECTED NOT DETECTED Final   Neisseria meningitidis NOT DETECTED NOT DETECTED Final   Pseudomonas aeruginosa NOT DETECTED NOT DETECTED Final   Candida albicans NOT DETECTED NOT DETECTED Final   Candida glabrata NOT  DETECTED NOT DETECTED Final   Candida krusei NOT DETECTED NOT DETECTED Final   Candida parapsilosis NOT DETECTED NOT DETECTED Final   Candida tropicalis NOT DETECTED NOT DETECTED Final  MRSA PCR  Screening     Status: None   Collection Time: 05/11/16  4:42 PM  Result Value Ref Range Status   MRSA by PCR NEGATIVE NEGATIVE Final    Comment:        The GeneXpert MRSA Assay (FDA approved for NASAL specimens only), is one component of a comprehensive MRSA colonization surveillance program. It is not intended to diagnose MRSA infection nor to guide or monitor treatment for MRSA infections.  C difficile quick scan w PCR reflex     Status: None   Collection Time: 05/13/16  7:58 AM  Result Value Ref Range Status   C Diff antigen NEGATIVE NEGATIVE Final   C Diff toxin NEGATIVE NEGATIVE Final   C Diff interpretation No C. difficile detected.  Final  CULTURE, BLOOD (ROUTINE X 2) w Reflex to ID Panel     Status: None (Preliminary result)   Collection Time: 05/13/16 10:55 AM  Result Value Ref Range Status   Specimen Description BLOOD LEFT HAND  Final   Special Requests NONE  Final   Culture NO GROWTH < 24 HOURS  Final   Report Status PENDING  Incomplete  CULTURE, BLOOD (ROUTINE X 2) w Reflex to ID Panel     Status: None (Preliminary result)   Collection Time: 05/13/16 11:09 AM  Result Value Ref Range Status   Specimen Description BLOOD LEFT ARM  Final   Special Requests AER 5CC ANA 5CC  Final   Culture NO GROWTH < 24 HOURS  Final   Report Status PENDING  Incomplete       Today   Subjective:   Ronald Fitzgerald today has no headache,no chest abdominal pain,no new weakness tingling or numbness, feels much better wants to go home today  Objective:   Blood pressure 124/74, pulse 66, temperature 98.5 F (36.9 C), temperature source Oral, resp. rate 18, height 5\' 10"  (1.778 m), weight 75 kg (165 lb 5.5 oz), SpO2 99 %.   Intake/Output Summary (Last 24 hours) at 05/14/16 1135 Last data filed at 05/14/16 0900  Gross per 24 hour  Intake   3152 ml  Output   1050 ml  Net   2102 ml    Exam Awake Alert, Oriented x 3, No new F.N deficits,  Normal affect [place].AT,PERRAL Supple Neck,No JVD, No cervical lymphadenopathy appriciated.  Symmetrical Chest wall movement, Good air movement bilaterally, CTAB RRR,No Gallops,Rubs or new Murmurs, No Parasternal Heave +ve B.Sounds, Abd Soft, Non tender, No organomegaly appriciated, No rebound -guarding or rigidity. No Cyanosis, Clubbing or edema, No new Rash or bruise  Data Review   Labs (Brief)       CBC w Diff: Lab Results  Component Value Date   WBC 10.4 05/14/2016   HGB 11.8* 05/14/2016   HCT 34.3* 05/14/2016   PLT 148* 05/14/2016   LYMPHOPCT 6 05/11/2016   MONOPCT 1 05/11/2016   EOSPCT 0 05/11/2016   BASOPCT 0 05/11/2016      Labs (Brief)       CMP: Lab Results  Component Value Date   NA 137 05/12/2016   K 3.4* 05/12/2016   CL 110 05/12/2016   CO2 23 05/12/2016   BUN 14 05/12/2016   CREATININE 0.96 05/12/2016   PROT 7.3 05/11/2016   ALBUMIN 4.4 05/11/2016   BILITOT 1.3*  05/11/2016   ALKPHOS 60 05/11/2016   AST 26 05/11/2016   ALT 16* 05/11/2016    .   Total Time in preparing paper work, data evaluation and todays exam - 35 minutes  KONIDENA,SNEHALATHA M.D on 05/14/2016 at 11:35 AM    Note: This dictation was prepared with Dragon dictation along with smaller phrase technology. Any transcriptional errors that result from this process are unintentional.        ED to Hosp-Admission (Discharged) on 05/11/2016        Detailed Report       Note shared with patient

## 2016-07-01 ENCOUNTER — Encounter: Payer: Self-pay | Admitting: *Deleted

## 2016-07-01 ENCOUNTER — Ambulatory Visit: Payer: No Typology Code available for payment source | Admitting: Anesthesiology

## 2016-07-01 ENCOUNTER — Ambulatory Visit
Admission: RE | Admit: 2016-07-01 | Discharge: 2016-07-01 | Disposition: A | Discharge status: Home / Self Care | Payer: No Typology Code available for payment source | Source: Ambulatory Visit | Attending: Surgery | Admitting: Surgery

## 2016-07-01 ENCOUNTER — Encounter
Admission: RE | Disposition: A | Discharge status: Home / Self Care | Payer: Self-pay | Source: Ambulatory Visit | Attending: Surgery

## 2016-07-01 DIAGNOSIS — M23304 Other meniscus derangements, unspecified medial meniscus, left knee: Secondary | ICD-10-CM | POA: Insufficient documentation

## 2016-07-01 DIAGNOSIS — Z87891 Personal history of nicotine dependence: Secondary | ICD-10-CM | POA: Diagnosis not present

## 2016-07-01 DIAGNOSIS — M1712 Unilateral primary osteoarthritis, left knee: Secondary | ICD-10-CM | POA: Insufficient documentation

## 2016-07-01 HISTORY — DX: Tubulo-interstitial nephritis, not specified as acute or chronic: N12

## 2016-07-01 HISTORY — PX: KNEE ARTHROSCOPY WITH MENISCAL REPAIR: SHX5653

## 2016-07-01 SURGERY — ARTHROSCOPY, KNEE, WITH MENISCUS REPAIR
Anesthesia: General | Site: Knee | Laterality: Left | Wound class: Clean

## 2016-07-01 MED ORDER — ONDANSETRON HCL 4 MG/2ML IJ SOLN
INTRAMUSCULAR | Status: DC | PRN
  Administered 2016-07-01: 4 mg via INTRAVENOUS

## 2016-07-01 MED ORDER — ACETAMINOPHEN 10 MG/ML IV SOLN
INTRAVENOUS | Status: DC | PRN
  Administered 2016-07-01: 1000 mg via INTRAVENOUS

## 2016-07-01 MED ORDER — OXYCODONE HCL 5 MG PO TABS
ORAL_TABLET | ORAL | Status: AC
  Filled 2016-07-01: qty 1

## 2016-07-01 MED ORDER — FENTANYL CITRATE (PF) 100 MCG/2ML IJ SOLN
INTRAMUSCULAR | Status: DC | PRN
  Administered 2016-07-01 (×2): 25 ug via INTRAVENOUS

## 2016-07-01 MED ORDER — PROPOFOL 10 MG/ML IV BOLUS
INTRAVENOUS | Status: DC | PRN
  Administered 2016-07-01: 160 mg via INTRAVENOUS

## 2016-07-01 MED ORDER — LIDOCAINE HCL 1 % IJ SOLN
INTRAMUSCULAR | Status: DC | PRN
  Administered 2016-07-01: 30 mL

## 2016-07-01 MED ORDER — ACETAMINOPHEN 10 MG/ML IV SOLN
INTRAVENOUS | Status: AC
  Filled 2016-07-01: qty 100

## 2016-07-01 MED ORDER — OXYCODONE HCL 5 MG/5ML PO SOLN: 5.0000 mg | Freq: Once | ORAL | Status: AC | PRN

## 2016-07-01 MED ORDER — MIDAZOLAM HCL 2 MG/2ML IJ SOLN
INTRAMUSCULAR | Status: DC | PRN
  Administered 2016-07-01: 2 mg via INTRAVENOUS

## 2016-07-01 MED ORDER — MEPERIDINE HCL 25 MG/ML IJ SOLN: 6.2500 mg | INTRAMUSCULAR | Status: DC | PRN

## 2016-07-01 MED ORDER — FAMOTIDINE 20 MG PO TABS
20.0000 mg | ORAL_TABLET | Freq: Once | ORAL | Status: AC
  Administered 2016-07-01: 20 mg via ORAL

## 2016-07-01 MED ORDER — BUPIVACAINE-EPINEPHRINE (PF) 0.5% -1:200000 IJ SOLN
INTRAMUSCULAR | Status: AC
  Filled 2016-07-01: qty 60

## 2016-07-01 MED ORDER — OXYCODONE HCL 5 MG PO TABS
5.0000 mg | ORAL_TABLET | Freq: Once | ORAL | Status: AC | PRN
  Administered 2016-07-01: 5 mg via ORAL

## 2016-07-01 MED ORDER — LIDOCAINE HCL (CARDIAC) 20 MG/ML IV SOLN
INTRAVENOUS | Status: DC | PRN
  Administered 2016-07-01: 80 mg via INTRAVENOUS

## 2016-07-01 MED ORDER — CEFAZOLIN SODIUM-DEXTROSE 2-4 GM/100ML-% IV SOLN
INTRAVENOUS | Status: AC
  Filled 2016-07-01: qty 100

## 2016-07-01 MED ORDER — FAMOTIDINE 20 MG PO TABS
ORAL_TABLET | ORAL | Status: AC
  Administered 2016-07-01: 20 mg via ORAL
  Filled 2016-07-01: qty 1

## 2016-07-01 MED ORDER — GLYCOPYRROLATE 0.2 MG/ML IJ SOLN
INTRAMUSCULAR | Status: DC | PRN
  Administered 2016-07-01: 0.2 mg via INTRAVENOUS

## 2016-07-01 MED ORDER — PHENYLEPHRINE HCL 10 MG/ML IJ SOLN
INTRAMUSCULAR | Status: DC | PRN
  Administered 2016-07-01 (×3): 50 ug via INTRAVENOUS

## 2016-07-01 MED ORDER — FENTANYL CITRATE (PF) 100 MCG/2ML IJ SOLN: 25.0000 ug | INTRAMUSCULAR | Status: DC | PRN

## 2016-07-01 MED ORDER — CEFAZOLIN SODIUM-DEXTROSE 2-4 GM/100ML-% IV SOLN
2.0000 g | Freq: Once | INTRAVENOUS | Status: AC
Start: 2016-07-01 — End: 2016-07-01
  Administered 2016-07-01: 2 g via INTRAVENOUS

## 2016-07-01 MED ORDER — PROMETHAZINE HCL 25 MG/ML IJ SOLN: 6.2500 mg | INTRAMUSCULAR | Status: DC | PRN

## 2016-07-01 MED ORDER — DEXAMETHASONE SODIUM PHOSPHATE 10 MG/ML IJ SOLN
INTRAMUSCULAR | Status: DC | PRN
  Administered 2016-07-01: 5 mg via INTRAVENOUS

## 2016-07-01 MED ORDER — LACTATED RINGERS IV SOLN
INTRAVENOUS | Status: DC
  Administered 2016-07-01: 07:00:00 via INTRAVENOUS

## 2016-07-01 MED ORDER — BUPIVACAINE-EPINEPHRINE (PF) 0.5% -1:200000 IJ SOLN
INTRAMUSCULAR | Status: DC | PRN
  Administered 2016-07-01: 20 mL via PERINEURAL
  Administered 2016-07-01: 30 mL via PERINEURAL

## 2016-07-01 MED ORDER — HYDROCODONE-ACETAMINOPHEN 5-325 MG PO TABS: 1.0000 | ORAL_TABLET | Freq: Four times a day (QID) | ORAL | 0 refills | Status: DC | PRN

## 2016-07-01 MED ORDER — LIDOCAINE HCL (PF) 1 % IJ SOLN
INTRAMUSCULAR | Status: AC
  Filled 2016-07-01: qty 30

## 2016-07-01 SURGICAL SUPPLY — 32 items
BAG COUNTER SPONGE EZ (MISCELLANEOUS) IMPLANT
BANDAGE ACE 6X5 VEL STRL LF (GAUZE/BANDAGES/DRESSINGS) ×2 IMPLANT
BLADE FULL RADIUS 3.5 (BLADE) ×2 IMPLANT
BLADE SHAVER 4.5X7 STR FR (MISCELLANEOUS) IMPLANT
CHLORAPREP W/TINT 26ML (MISCELLANEOUS) ×2 IMPLANT
CUFF TOURN 24 STER (MISCELLANEOUS) ×2 IMPLANT
CUFF TOURN 30 STER DUAL PORT (MISCELLANEOUS) IMPLANT
DRAPE IMP U-DRAPE 54X76 (DRAPES) ×2 IMPLANT
ELECT REM PT RETURN 9FT ADLT (ELECTROSURGICAL) ×2
ELECTRODE REM PT RTRN 9FT ADLT (ELECTROSURGICAL) ×1 IMPLANT
GAUZE SPONGE 4X4 12PLY STRL (GAUZE/BANDAGES/DRESSINGS) ×2 IMPLANT
GLOVE BIO SURGEON STRL SZ8 (GLOVE) ×4 IMPLANT
GLOVE BIOGEL M 7.0 STRL (GLOVE) ×4 IMPLANT
GLOVE BIOGEL PI IND STRL 7.5 (GLOVE) ×1 IMPLANT
GLOVE BIOGEL PI INDICATOR 7.5 (GLOVE) ×1
GLOVE INDICATOR 8.0 STRL GRN (GLOVE) ×2 IMPLANT
GOWN STRL REUS W/ TWL LRG LVL3 (GOWN DISPOSABLE) ×1 IMPLANT
GOWN STRL REUS W/ TWL XL LVL3 (GOWN DISPOSABLE) ×2 IMPLANT
GOWN STRL REUS W/TWL LRG LVL3 (GOWN DISPOSABLE) ×1
GOWN STRL REUS W/TWL XL LVL3 (GOWN DISPOSABLE) ×2
IV LACTATED RINGER IRRG 3000ML (IV SOLUTION) ×1
IV LR IRRIG 3000ML ARTHROMATIC (IV SOLUTION) ×1 IMPLANT
KIT RM TURNOVER STRD PROC AR (KITS) ×2 IMPLANT
MANIFOLD NEPTUNE II (INSTRUMENTS) ×2 IMPLANT
NEEDLE HYPO 21X1.5 SAFETY (NEEDLE) ×2 IMPLANT
PACK ARTHROSCOPY KNEE (MISCELLANEOUS) ×2 IMPLANT
PENCIL ELECTRO HAND CTR (MISCELLANEOUS) ×2 IMPLANT
SUT PROLENE 4 0 PS 2 18 (SUTURE) ×2 IMPLANT
SUT TI-CRON 2-0 W/10 SWGD (SUTURE) IMPLANT
SYR 50ML LL SCALE MARK (SYRINGE) ×2 IMPLANT
TUBING ARTHRO INFLOW-ONLY STRL (TUBING) ×2 IMPLANT
WAND HAND CNTRL MULTIVAC 90 (MISCELLANEOUS) IMPLANT

## 2016-07-01 NOTE — Anesthesia Postprocedure Evaluation (Signed)
Anesthesia Post Note  Patient: Ronald Fitzgerald  Procedure(s) Performed: Procedure(s) (LRB): KNEE ARTHROSCOPY WITH PARTIAL MEDIAL MENISECRTOMY (Left)  Patient location during evaluation: PACU Anesthesia Type: General Level of consciousness: oriented and awake and alert Pain management: pain level controlled Vital Signs Assessment: post-procedure vital signs reviewed and stable Respiratory status: spontaneous breathing, nonlabored ventilation and respiratory function stable Cardiovascular status: blood pressure returned to baseline and stable Postop Assessment: no signs of nausea or vomiting Anesthetic complications: no    Last Vitals:  Vitals:   07/01/16 0841 07/01/16 0855  BP: 101/71   Pulse: 69 76  Resp: 11 16  Temp:      Last Pain:  Vitals:   07/01/16 0612  TempSrc: Oral  PainSc: 4                  [name]

## 2016-07-01 NOTE — Discharge Instructions (Addendum)
AMBULATORY SURGERY  DISCHARGE INSTRUCTIONS   1) The drugs that you were given will stay in your system until tomorrow so for the next 24 hours you should not:  A) Drive an automobile B) Make any legal decisions C) Drink any alcoholic beverage   2) You may resume regular meals tomorrow.  Today it is better to start with liquids and gradually work up to solid foods.  You may eat anything you prefer, but it is better to start with liquids, then soup and crackers, and gradually work up to solid foods.   3) Please notify your doctor immediately if you have any unusual bleeding, trouble breathing, redness and pain at the surgery site, drainage, fever, or pain not relieved by medication.    4) Additional Instructions:        Please contact your physician with any problems or Same Day Surgery at [phone], Monday through Friday 6 am to 4 pm, or [place] at [facility] Main number at [phone].AMBULATORY SURGERY  DISCHARGE INSTRUCTIONS   5) The drugs that you were given will stay in your system until tomorrow so for the next 24 hours you should not:  D) Drive an automobile E) Make any legal decisions F) Drink any alcoholic beverage   6) You may resume regular meals tomorrow.  Today it is better to start with liquids and gradually work up to solid foods.  You may eat anything you prefer, but it is better to start with liquids, then soup and crackers, and gradually work up to solid foods.   7) Please notify your doctor immediately if you have any unusual bleeding, trouble breathing, redness and pain at the surgery site, drainage, fever, or pain not relieved by medication.    8) Additional Instructions:        Please contact your physician with any problems or Same Day Surgery at [phone], Monday through Friday 6 am to 4 pm, or [place] at [facility] Main number at [phone].Keep dressing dry and intact.  May shower after dressing changed on post-op day #4  (Monday).  Cover sutures with Band-Aids after drying off. Apply ice frequently to knee. Take ibuprofen 800 mg TID with meals for 7-10 days, then as necessary. Take pain medication as prescribed or ES Tylenol when needed.  May weight-bear as tolerated - use crutches or walker as needed. Follow-up in 10-14 days or as scheduled.

## 2016-07-01 NOTE — Progress Notes (Signed)
Applied ice pack to knee and elevated on pillows

## 2016-07-01 NOTE — Anesthesia Preprocedure Evaluation (Signed)
Anesthesia Evaluation  Patient identified by MRN, date of birth, ID band Patient awake    Reviewed: Allergy & Precautions, NPO status , Patient's Chart, lab work & pertinent test results  History of Anesthesia Complications Negative for: history of anesthetic complications  Airway Mallampati: II  TM Distance: >3 FB Neck ROM: Full    Dental  (+) Partial Upper   Pulmonary neg sleep apnea, neg COPD, former smoker,    breath sounds clear to auscultation- rhonchi (-) wheezing      Cardiovascular Exercise Tolerance: Good (-) hypertension(-) CAD and (-) Past MI  Rhythm:Regular Rate:Normal - Systolic murmurs and - Diastolic murmurs    Neuro/Psych  Headaches, negative psych ROS   GI/Hepatic negative GI ROS, Neg liver ROS,   Endo/Other  negative endocrine ROSneg diabetes  Renal/GU negative Renal ROS     Musculoskeletal   Abdominal (+) - obese,   Peds  Hematology negative hematology ROS (+)   Anesthesia Other Findings Past Medical History: No date: BPH (benign prostatic hyperplasia) No date: Headache     Comment: MIGRAINES No date: Hepatitis     Comment: C-PT STATES THAT HEP C IS RESOLVED 05/2016: Pyelonephritis 05/2016: Sepsis ([facility])     Comment: FROM UTI   Reproductive/Obstetrics                             Anesthesia Physical Anesthesia Plan  ASA: II  Anesthesia Plan: General   Post-op Pain Management:    Induction: Intravenous  Airway Management Planned: LMA  Additional Equipment:   Intra-op Plan:   Post-operative Plan:   Informed Consent: I have reviewed the patients History and Physical, chart, labs and discussed the procedure including the risks, benefits and alternatives for the proposed anesthesia with the patient or authorized representative who has indicated his/her understanding and acceptance.   Dental advisory given  Plan Discussed with: CRNA and  Anesthesiologist  Anesthesia Plan Comments:         Anesthesia Quick Evaluation

## 2016-07-01 NOTE — Op Note (Signed)
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  Postoperative diagnosis:   Same.  Procedure:   Arthroscopic debridement with partial medial meniscectomy, left knee.  Surgeon:   Maryagnes AmosJ. Jeffrey Poggi, M.D.  Anesthesia:   General LMA.  Findings:   As above. There were grade 1-2 chondromalacial changes involving the medial tibial plateau and medial femoral condyle. Laterally, the meniscus was intact, as were the articular surfaces of the femur and tibia. The patellofemoral compartment also was well maintained. The anterior and posterior cruciate limits both were in satisfactory condition.  Complications:   None.  EBL:   2 cc.  Total fluids:   700 cc of crystalloid.  Tourniquet time:   None  Drains:   None  Closure:   4-0 Prolene interrupted sutures.  Brief clinical note:   The patient is a 55 year old male who sustained the above-noted injury approximately 8 months ago while hiking. His symptoms have persisted despite medications, activity modification, etc. His history and examination are consistent with a medial meniscus tear, confirmed by MRI scan.. The patient presents at this time for arthroscopy, debridement, and partial medial meniscectomy.  Procedure:   The patient was brought into the operating room and lain in the supine position. After adequate general laryngal mask anesthesia was obtained, a timeout was performed to verify the appropriate side. The patient's left knee was injected sterilely using a solution of 30 cc of 1% lidocaine and 30 cc of 0.5% Sensorcaine with epinephrine. The left lower extremity was prepped with ChloraPrep solution before being draped sterilely. Preoperative antibiotics were administered. The expected portal sites were injected with 0.5% Sensorcaine with epinephrine before the camera was placed in the anterolateral portal and instrumentation performed through the  anteromedial portal. The knee was sequentially examined beginning in the suprapatellar pouch, then progressing to the patellofemoral space, the medial gutter compartment, the notch, and finally the lateral compartment and gutter. The findings were as described above. Abundant reactive synovial tissues anteriorly were debrided using the full-radius resector in order to improve visualization. The unstable flap tear of the posteromedial portion of the medial meniscus was debrided back to stable margins using a combination of the straight basket, the up-biting basket, and the curved basket, as well as with the full-radius resector. Subsequent probing of the remaining rim demonstrated excellent stability. Laterally, the meniscus was intact to probing. The instruments were removed from the joint after suctioning the excess fluid. The portal sites were closed using 4-0 Prolene interrupted sutures before a sterile bulky dressing was applied to the knee. The patient was then awakened, extubated, and returned to the recovery room in satisfactory condition after tolerating the procedure well.

## 2016-07-01 NOTE — Progress Notes (Signed)
No pain reported on discharge

## 2016-07-01 NOTE — Progress Notes (Signed)
Pedal pulse positive to left foot 

## 2016-07-01 NOTE — H&P (Signed)
Paper H&P to be scanned into permanent record. H&P reviewed. No changes. 

## 2016-07-01 NOTE — Transfer of Care (Signed)
Immediate Anesthesia Transfer of Care Note  Patient: Ronald Fitzgerald  Procedure(s) Performed: Procedure(s): KNEE ARTHROSCOPY WITH PARTIAL MEDIAL MENISECRTOMY (Left)  Patient Location: PACU  Anesthesia Type:General  Level of Consciousness: sedated  Airway & Oxygen Therapy: Patient Spontanous Breathing and Patient connected to face mask oxygen  Post-op Assessment: Post op vital signs stable  Post vital signs: Reviewed and stable  Last Vitals:  Vitals:   07/01/16 0612  BP: 112/74  Pulse: 72  Resp: 16  Temp: 36.6 C    Last Pain:  Vitals:   07/01/16 0612  TempSrc: Oral  PainSc: 4          Complications: No apparent anesthesia complications

## 2016-07-01 NOTE — Anesthesia Procedure Notes (Signed)
Procedure Name: LMA Insertion Performed by: Kemar Pandit Pre-anesthesia Checklist: Patient identified, Patient being monitored, Timeout performed, Emergency Drugs available and Suction available Patient Re-evaluated:Patient Re-evaluated prior to inductionOxygen Delivery Method: Circle system utilized Preoxygenation: Pre-oxygenation with 100% oxygen Intubation Type: IV induction Ventilation: Mask ventilation without difficulty LMA: LMA inserted LMA Size: 5.0 Tube type: Oral Number of attempts: 1 Placement Confirmation: positive ETCO2 and breath sounds checked- equal and bilateral Tube secured with: Tape Dental Injury: Teeth and Oropharynx as per pre-operative assessment      

## 2016-07-12 ENCOUNTER — Other Ambulatory Visit: Payer: Self-pay

## 2016-08-27 ENCOUNTER — Other Ambulatory Visit: Payer: No Typology Code available for payment source

## 2016-08-27 DIAGNOSIS — N400 Enlarged prostate without lower urinary tract symptoms: Secondary | ICD-10-CM

## 2016-08-28 LAB — PSA: Prostate Specific Ag, Serum: 6.5 ng/mL — ABNORMAL HIGH (ref 0.0–4.0)

## 2016-08-30 ENCOUNTER — Telehealth: Payer: Self-pay

## 2016-08-30 NOTE — Telephone Encounter (Signed)
-----   Message from Harle BattiestShannon A McGowan, PA-C sent at 08/29/2016  9:37 PM EDT ----- Will you please add a free and total PSA to the patient's blood work?

## 2016-08-30 NOTE — Telephone Encounter (Signed)
Labs added.

## 2016-08-31 ENCOUNTER — Telehealth: Payer: Self-pay | Admitting: Urology

## 2016-08-31 NOTE — Telephone Encounter (Signed)
Patient called the office today to discuss his lab results.  I advised him of the PSA value, but he has additional questions.  What is the recommendation for follow up for this patient?  Please contact the patient.

## 2016-09-01 ENCOUNTER — Telehealth: Payer: Self-pay

## 2016-09-01 NOTE — Telephone Encounter (Signed)
Spoke with pt in reference to 24% probability of cancer. Made aware most urologist recommend a prostate bx or MRI. Pt stated "yeah im not doing any of that." Pt went on to state that he has been taking the saw palmetto and his urinary symptoms have gotten much better and he is not going to do any other treatments at this time. Reinforced with pt that he could be passing up cancer if not having one of these studies. Pt voiced understanding again stating he is not having any test at this time.

## 2016-09-01 NOTE — Telephone Encounter (Signed)
Pt was contacted and documented in another encounter.

## 2016-09-01 NOTE — Telephone Encounter (Signed)
He should have a follow up visit in 6 months regardless if he does not have a biopsy or not.

## 2016-09-01 NOTE — Telephone Encounter (Signed)
-----   Message from Harle BattiestShannon A McGowan, PA-C sent at 08/31/2016  8:29 AM EDT ----- Patient's blood work demonstrates a 24% probability of having prostate cancer.  Most urologist believe a patient should undergo a biopsy when the probability reaches 25%.  At this time, I would suggest undergoing a prostate biopsy or a MRI of the prostate for further investigation.

## 2016-09-01 NOTE — Telephone Encounter (Signed)
Can you make this? 

## 2016-09-06 ENCOUNTER — Telehealth: Payer: Self-pay | Admitting: Urology

## 2016-09-06 NOTE — Telephone Encounter (Signed)
Patient stated that he will be finding another urologist and will not be coming back.    Ronald Fitzgerald

## 2016-09-21 LAB — PSA, TOTAL AND FREE
PSA FREE PCT: 12.1 %
PSA FREE: 0.8 ng/mL
Prostate Specific Ag, Serum: 6.6 ng/mL — ABNORMAL HIGH (ref 0.0–4.0)

## 2016-09-21 LAB — SPECIMEN STATUS REPORT

## 2016-12-22 ENCOUNTER — Encounter: Payer: Self-pay | Admitting: *Deleted

## 2017-01-13 ENCOUNTER — Ambulatory Visit: Payer: No Typology Code available for payment source | Admitting: Certified Registered Nurse Anesthetist

## 2017-01-13 ENCOUNTER — Encounter
Admission: RE | Disposition: A | Discharge status: Home / Self Care | Payer: Self-pay | Source: Ambulatory Visit | Attending: Ophthalmology

## 2017-01-13 ENCOUNTER — Ambulatory Visit
Admission: RE | Admit: 2017-01-13 | Discharge: 2017-01-13 | Disposition: A | Discharge status: Home / Self Care | Payer: No Typology Code available for payment source | Source: Ambulatory Visit | Attending: Ophthalmology | Admitting: Ophthalmology

## 2017-01-13 ENCOUNTER — Encounter: Payer: Self-pay | Admitting: *Deleted

## 2017-01-13 DIAGNOSIS — N12 Tubulo-interstitial nephritis, not specified as acute or chronic: Secondary | ICD-10-CM | POA: Diagnosis not present

## 2017-01-13 DIAGNOSIS — Z87891 Personal history of nicotine dependence: Secondary | ICD-10-CM | POA: Insufficient documentation

## 2017-01-13 DIAGNOSIS — H2512 Age-related nuclear cataract, left eye: Secondary | ICD-10-CM | POA: Diagnosis present

## 2017-01-13 DIAGNOSIS — R51 Headache: Secondary | ICD-10-CM | POA: Insufficient documentation

## 2017-01-13 HISTORY — PX: CATARACT EXTRACTION W/PHACO: SHX586

## 2017-01-13 SURGERY — PHACOEMULSIFICATION, CATARACT, WITH IOL INSERTION
Anesthesia: Monitor Anesthesia Care | Site: Eye | Laterality: Left | Wound class: Clean

## 2017-01-13 MED ORDER — CARBACHOL 0.01 % IO SOLN
INTRAOCULAR | Status: DC | PRN
  Administered 2017-01-13: 0.5 mL via INTRAOCULAR

## 2017-01-13 MED ORDER — SODIUM HYALURONATE 10 MG/ML IO SOLN
INTRAOCULAR | Status: DC | PRN
  Administered 2017-01-13: 0.85 mL via INTRAOCULAR

## 2017-01-13 MED ORDER — EPINEPHRINE PF 1 MG/ML IJ SOLN
INTRAOCULAR | Status: DC | PRN
  Administered 2017-01-13: 11:00:00 via OPHTHALMIC

## 2017-01-13 MED ORDER — FENTANYL CITRATE (PF) 100 MCG/2ML IJ SOLN
INTRAMUSCULAR | Status: DC | PRN
  Administered 2017-01-13: 50 ug via INTRAVENOUS

## 2017-01-13 MED ORDER — MIDAZOLAM HCL 2 MG/2ML IJ SOLN
INTRAMUSCULAR | Status: DC | PRN
  Administered 2017-01-13 (×2): 1 mg via INTRAVENOUS

## 2017-01-13 MED ORDER — SODIUM CHLORIDE 0.9 % IV SOLN
INTRAVENOUS | Status: DC
  Administered 2017-01-13: 10:00:00 via INTRAVENOUS

## 2017-01-13 MED ORDER — POVIDONE-IODINE 5 % OP SOLN
OPHTHALMIC | Status: DC | PRN
  Administered 2017-01-13: 1 via OPHTHALMIC

## 2017-01-13 MED ORDER — SODIUM HYALURONATE 23 MG/ML IO SOLN
INTRAOCULAR | Status: DC | PRN
  Administered 2017-01-13: 0.6 mL via INTRAOCULAR

## 2017-01-13 MED ORDER — MOXIFLOXACIN HCL 0.5 % OP SOLN
OPHTHALMIC | Status: DC | PRN
  Administered 2017-01-13: 0.2 mL via OPHTHALMIC

## 2017-01-13 MED ORDER — ARMC OPHTHALMIC DILATING DROPS
1.0000 "application " | OPHTHALMIC | Status: AC
  Administered 2017-01-13 (×3): 1 via OPHTHALMIC

## 2017-01-13 MED ORDER — LIDOCAINE HCL (PF) 4 % IJ SOLN
INTRAOCULAR | Status: DC | PRN
  Administered 2017-01-13: 4 mL via OPHTHALMIC

## 2017-01-13 MED ORDER — SODIUM HYALURONATE 10 MG/ML IO SOLN
INTRAOCULAR | Status: AC
  Filled 2017-01-13: qty 0.85

## 2017-01-13 MED ORDER — MOXIFLOXACIN HCL 0.5 % OP SOLN: 1.0000 [drp] | OPHTHALMIC | Status: DC | PRN

## 2017-01-13 MED ORDER — POVIDONE-IODINE 5 % OP SOLN
OPHTHALMIC | Status: AC
  Filled 2017-01-13: qty 30

## 2017-01-13 MED ORDER — SODIUM HYALURONATE 23 MG/ML IO SOLN
INTRAOCULAR | Status: AC
  Filled 2017-01-13: qty 0.6

## 2017-01-13 MED ORDER — MIDAZOLAM HCL 2 MG/2ML IJ SOLN
INTRAMUSCULAR | Status: AC
  Filled 2017-01-13: qty 2

## 2017-01-13 MED ORDER — EPINEPHRINE PF 1 MG/ML IJ SOLN
INTRAMUSCULAR | Status: AC
  Filled 2017-01-13: qty 2

## 2017-01-13 MED ORDER — FENTANYL CITRATE (PF) 100 MCG/2ML IJ SOLN
INTRAMUSCULAR | Status: AC
  Filled 2017-01-13: qty 2

## 2017-01-13 SURGICAL SUPPLY — 22 items
CANNULA ANT/CHMB 27GA (MISCELLANEOUS) ×4 IMPLANT
CUP MEDICINE 2OZ PLAST GRAD ST (MISCELLANEOUS) ×2 IMPLANT
DISSECTOR HYDRO NUCLEUS 50X22 (MISCELLANEOUS) ×2 IMPLANT
GLOVE BIO SURGEON STRL SZ8 (GLOVE) ×2 IMPLANT
GLOVE BIOGEL M 6.5 STRL (GLOVE) ×2 IMPLANT
GLOVE SURG LX 7.5 STRW (GLOVE) ×1
GLOVE SURG LX STRL 7.5 STRW (GLOVE) ×1 IMPLANT
GOWN STRL REUS W/ TWL LRG LVL3 (GOWN DISPOSABLE) ×2 IMPLANT
GOWN STRL REUS W/TWL LRG LVL3 (GOWN DISPOSABLE) ×2
LENS IOL ACRSF IQ TRC 5 16.0 ×1 IMPLANT
LENS IOL ACRYSOF IQ TORIC 16.0 ×1 IMPLANT
LENS IOL IQ TORIC 5 16.0 ×1 IMPLANT
PACK CATARACT (MISCELLANEOUS) ×2 IMPLANT
PACK CATARACT KING (MISCELLANEOUS) ×2 IMPLANT
PACK EYE AFTER SURG (MISCELLANEOUS) ×2 IMPLANT
SOL BSS BAG (MISCELLANEOUS) ×2
SOLUTION BSS BAG (MISCELLANEOUS) ×1 IMPLANT
SYR 3ML LL SCALE MARK (SYRINGE) ×4 IMPLANT
SYR 5ML LL (SYRINGE) ×2 IMPLANT
SYR TB 1ML 27GX1/2 LL (SYRINGE) ×2 IMPLANT
WATER STERILE IRR 250ML POUR (IV SOLUTION) ×2 IMPLANT
WIPE NON LINTING 3.25X3.25 (MISCELLANEOUS) ×2 IMPLANT

## 2017-01-13 NOTE — H&P (Signed)
The History and Physical notes are on paper, have been signed, and are to be scanned.   I have examined the patient and there are no changes to the H&P.   Ronald BladeBradley Vedanshi Fitzgerald 01/13/2017 11:13 AM

## 2017-01-13 NOTE — Anesthesia Postprocedure Evaluation (Signed)
Anesthesia Post Note  Patient: Ronald Fitzgerald  Procedure(s) Performed: Procedure(s) (LRB): CATARACT EXTRACTION PHACO AND INTRAOCULAR LENS PLACEMENT (IOC) (Left)  Patient location during evaluation: PACU Anesthesia Type: MAC Level of consciousness: awake and alert and oriented Pain management: pain level controlled Vital Signs Assessment: post-procedure vital signs reviewed and stable Respiratory status: spontaneous breathing, nonlabored ventilation and respiratory function stable Cardiovascular status: blood pressure returned to baseline and stable Postop Assessment: no signs of nausea or vomiting Anesthetic complications: no     Last Vitals:  Vitals:   01/13/17 1217 01/13/17 1235  BP: 116/83 112/82  Pulse: 64 63  Resp: 16 16  Temp:      Last Pain:  Vitals:   01/13/17 1235  TempSrc:   PainSc: 0-No pain                 Coleta Grosshans

## 2017-01-13 NOTE — Op Note (Signed)
OPERATIVE NOTE  Molinda BailiffWilliam R Henegar [id number] 01/13/2017   PREOPERATIVE DIAGNOSIS:  Nuclear sclerotic cataract left eye.  H25.12   POSTOPERATIVE DIAGNOSIS:    Nuclear sclerotic cataract left eye.     PROCEDURE:  Phacoemusification with posterior chamber intraocular lens placement of the left eye (toric lens to correct corneal astigmatism)  LENS:   Implant Name Type Inv. Item Serial No. Manufacturer Lot No. LRB No. Used  LENS IOL TORIC 16.0 - [id number]   LENS IOL TORIC 16.0 [id number] [id number] ALCON   Left 1       [id number] 16.0 D 3.00 cyl   ULTRASOUND TIME: 0 minutes 52 seconds.  CDE 7.52   SURGEON:  Willey BladeBradley Molley Houser, MD, MPH   ANESTHESIA:  Topical with tetracaine drops augmented with 1% preservative-free intracameral lidocaine.  ESTIMATED BLOOD LOSS: <1 mL   COMPLICATIONS:  None.   DESCRIPTION OF PROCEDURE:  The patient was identified in the holding room and transported to the operating room and placed in the supine position under the operating microscope.  The left eye was identified as the operative eye and it was prepped and draped in the usual sterile ophthalmic fashion.  The verion system was registered with the microscope.   A 1.0 millimeter clear-corneal paracentesis was made at the 5:00 position. 0.5 ml of preservative-free 1% lidocaine with epinephrine was injected into the anterior chamber.  The anterior chamber was filled with Healon 5 viscoelastic.  A 2.4 millimeter keratome was used to make a near-clear corneal incision at the 2:00 position.  A curvilinear capsulorrhexis was made with a cystotome and capsulorrhexis forceps.  Balanced salt solution was used to hydrodissect and hydrodelineate the nucleus.   Phacoemulsification was then used in stop and chop fashion to remove the lens nucleus and epinucleus.  The remaining cortex was then removed using the irrigation and aspiration handpiece. Healon was then placed into the capsular bag to distend it for lens placement.  A lens was  then injected into the capsular bag and rotated to 005 degrees.  The remaining viscoelastic was aspirated.  The lens was well positioned and in alignment with the verion at 005.   Wounds were hydrated with balanced salt solution.  The anterior chamber was inflated to a physiologic pressure with balanced salt solution.   Intracameral vigamox 0.1 mL undiltued was injected into the eye and a drop placed onto the ocular surface.  No wound leaks were noted.  The patient was taken to the recovery room in stable condition without complications of anesthesia or surgery  Willey BladeBradley Parke Jandreau 01/13/2017, 12:01 PM

## 2017-01-13 NOTE — Anesthesia Post-op Follow-up Note (Cosign Needed)
Anesthesia QCDR form completed.        

## 2017-01-13 NOTE — Transfer of Care (Signed)
Immediate Anesthesia Transfer of Care Note  Patient: Ronald Fitzgerald  Procedure(s) Performed: Procedure(s) with comments: CATARACT EXTRACTION PHACO AND INTRAOCULAR LENS PLACEMENT ([place]) (Left) - [place] 00:50 AP% 14.5 CDE 7.52 Fluid pack lot # [id number]  Patient Location: PACU  Anesthesia Type:MAC  Level of Consciousness: awake, alert  and oriented  Airway & Oxygen Therapy: Patient Spontanous Breathing  Post-op Assessment: Report given to RN and Post -op Vital signs reviewed and stable  Post vital signs: Reviewed and stable  Last Vitals:  Vitals:   12/22/16 1315 01/13/17 0952  BP: (!) 97/57 111/70  Pulse: 82 69  Resp:  16  Temp:  36.8 C    Last Pain:  Vitals:   01/13/17 0952  TempSrc: Oral         Complications: No apparent anesthesia complications

## 2017-01-13 NOTE — Anesthesia Preprocedure Evaluation (Signed)
Anesthesia Evaluation  Patient identified by MRN, date of birth, ID band Patient awake    Reviewed: Allergy & Precautions, NPO status , Patient's Chart, lab work & pertinent test results  History of Anesthesia Complications Negative for: history of anesthetic complications  Airway Mallampati: I  TM Distance: >3 FB Neck ROM: Full    Dental  (+) Partial Upper   Pulmonary neg sleep apnea, neg COPD, former smoker,    breath sounds clear to auscultation- rhonchi (-) wheezing      Cardiovascular Exercise Tolerance: Good (-) hypertension(-) CAD and (-) Past MI  Rhythm:Regular Rate:Normal - Systolic murmurs and - Diastolic murmurs    Neuro/Psych  Headaches, negative psych ROS   GI/Hepatic negative GI ROS, (+) Hepatitis -, C  Endo/Other  negative endocrine ROSneg diabetes  Renal/GU negative Renal ROS     Musculoskeletal negative musculoskeletal ROS (+)   Abdominal (+) - obese,   Peds  Hematology negative hematology ROS (+)   Anesthesia Other Findings Past Medical History: No date: BPH (benign prostatic hyperplasia) No date: Headache     Comment: MIGRAINES No date: Hepatitis     Comment: C-PT STATES THAT HEP C IS RESOLVED 05/2016: Pyelonephritis 05/2016: Sepsis ([place])     Comment: FROM UTI   Reproductive/Obstetrics                             Anesthesia Physical Anesthesia Plan  ASA: II  Anesthesia Plan: MAC   Post-op Pain Management:    Induction: Intravenous  Airway Management Planned: Natural Airway  Additional Equipment:   Intra-op Plan:   Post-operative Plan:   Informed Consent: I have reviewed the patients History and Physical, chart, labs and discussed the procedure including the risks, benefits and alternatives for the proposed anesthesia with the patient or authorized representative who has indicated his/her understanding and acceptance.     Plan Discussed with: CRNA  and Anesthesiologist  Anesthesia Plan Comments:         Anesthesia Quick Evaluation

## 2017-01-13 NOTE — Anesthesia Procedure Notes (Signed)
Procedure Name: MAC Date/Time: 01/13/2017 9:21 AM Performed by: Darlyne Russian Pre-anesthesia Checklist: Patient identified, Emergency Drugs available, Suction available, Patient being monitored and Timeout performed Patient Re-evaluated:Patient Re-evaluated prior to inductionOxygen Delivery Method: Nasal cannula

## 2017-01-13 NOTE — Discharge Instructions (Signed)
Eye Surgery Discharge Instructions  Expect mild scratchy sensation or mild soreness. DO NOT RUB YOUR EYE!  The day of surgery:  Minimal physical activity, but bed rest is not required  No reading, computer work, or close hand work  No bending, lifting, or straining.  May watch TV  For 24 hours:  No driving, legal decisions, or alcoholic beverages  Safety precautions  Eat anything you prefer: It is better to start with liquids, then soup then solid foods.  _____ Eye patch should be worn until postoperative exam tomorrow.  ____ Solar shield eyeglasses should be worn for comfort in the sunlight/patch while sleeping  Resume all regular medications including aspirin or Coumadin if these were discontinued prior to surgery. You may shower, bathe, shave, or wash your hair. Tylenol may be taken for mild discomfort.  Call your doctor if you experience significant pain, nausea, or vomiting, fever > 101 or other signs of infection. [phone] or [phone] Specific instructions:  Follow-up Information    Willey BladeBradley King, MD Follow up.   Specialty:  Ophthalmology Why:  Tomorrow 01/14/17 at 9:35 am Contact information: [address] [place] [address] [phone]

## 2017-01-14 ENCOUNTER — Encounter: Payer: Self-pay | Admitting: Ophthalmology

## 2018-05-16 IMAGING — MR MR KNEE*L* W/O CM
6 series · 37 of 40 positions shown · non-contrast
Comparison: None.

CLINICAL DATA: Left knee pain. Possible injury running or hiking 6
months ago. Initial encounter.

EXAM:
MRI OF THE LEFT KNEE WITHOUT CONTRAST
TECHNIQUE: Multiplanar, multisequence MR imaging of the knee was performed. No
intravenous contrast was administered.

[Series 3: PD fat-sat · axial · 3.0mm · 0.50mm/px · z∈[-77,+48]mm · 9 of 39 slices shown (1 of 4)]
[im 1/39]
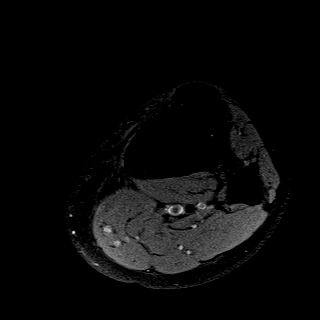
[im 5/39]
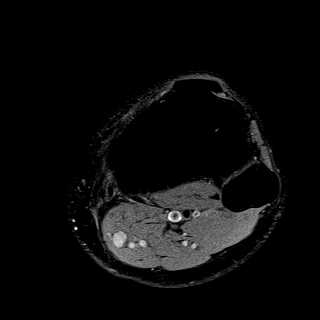
[im 10/39]
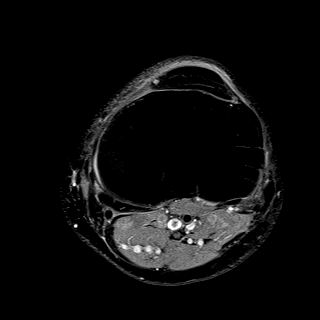
[im 15/39]
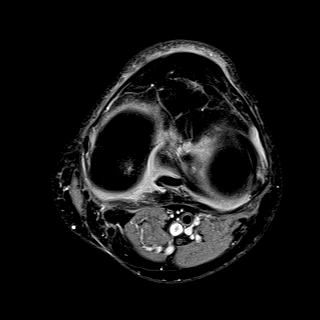
[im 20/39]
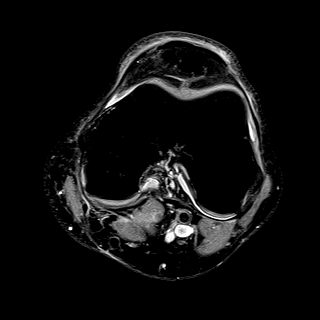
[im 24/39]
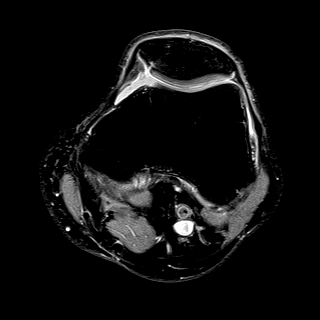
[im 29/39]
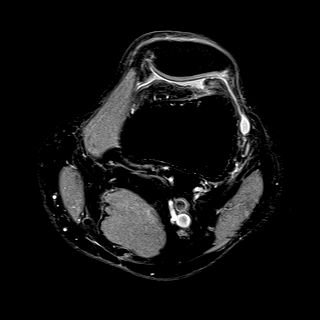
[im 34/39]
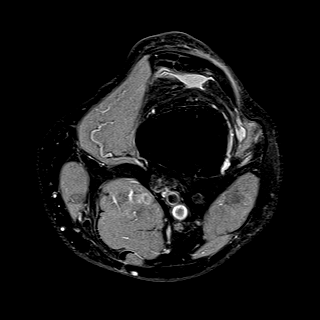
[im 39/39]
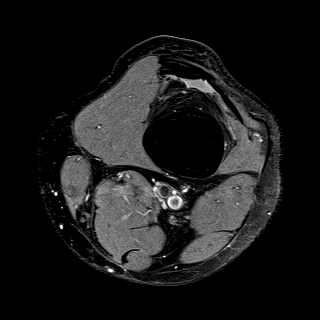

[Series 4: T1 · coronal · 3.0mm · 0.50mm/px · 4 of 31 slices shown]
[im 1/31]
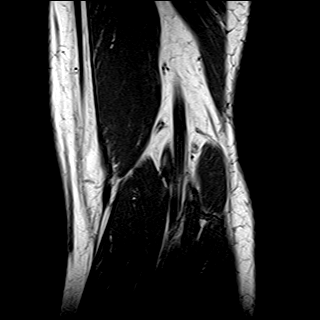
[im 6/31]
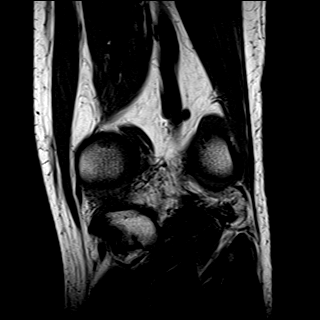
[im 11/31]
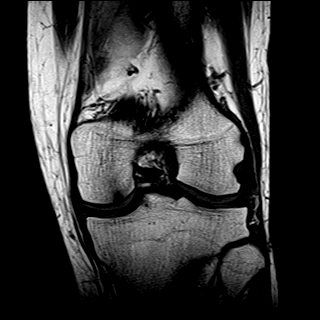
[im 16/31]
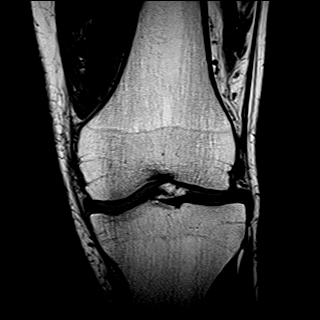

[Series 5: T2 fat-sat · coronal · 3.0mm · 0.31mm/px · 7 of 31 slices shown]
[im 1/31]
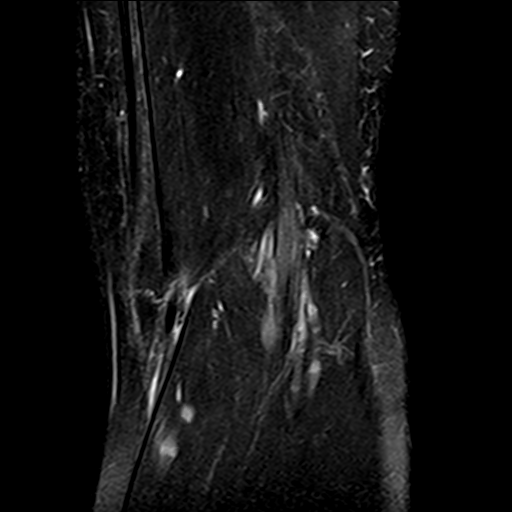
[im 6/31]
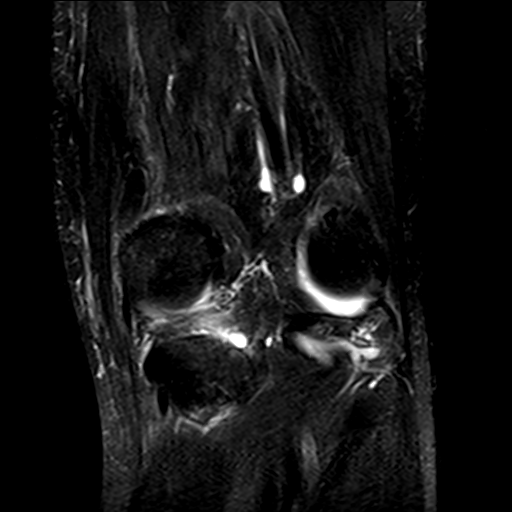
[im 11/31]
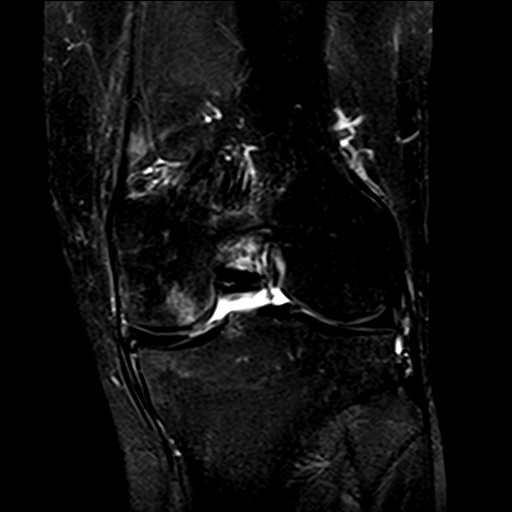
[im 16/31]
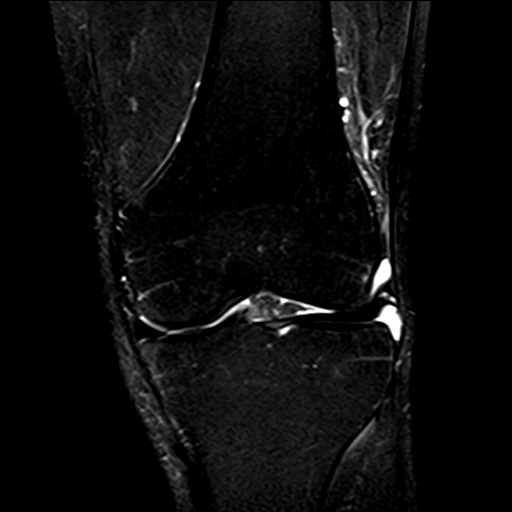
[im 21/31]
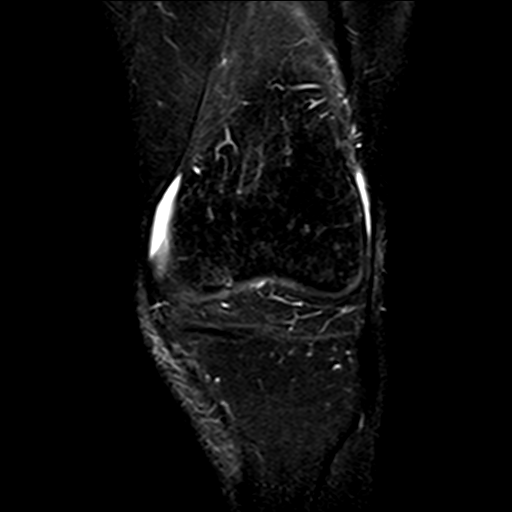
[im 26/31]
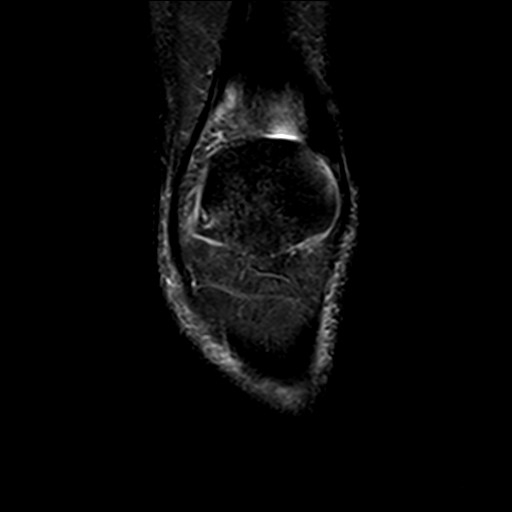
[im 31/31]
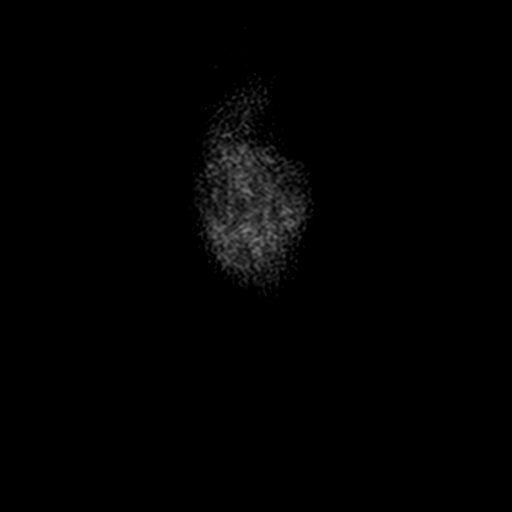

[Series 6: PD fat-sat · coronal · 3.0mm · 0.50mm/px · 7 of 31 slices shown (2 of 4)]
[im 1/31]
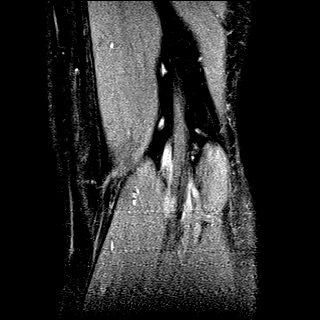
[im 6/31]
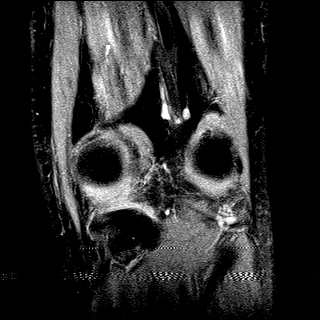
[im 11/31]
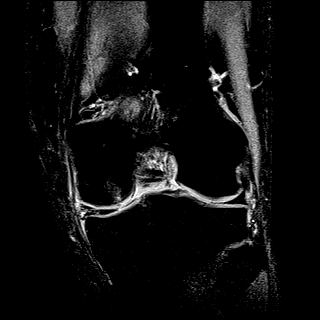
[im 16/31]
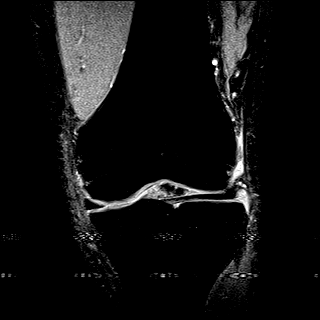
[im 21/31]
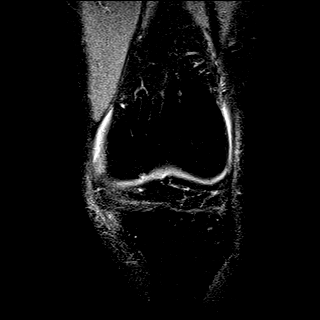
[im 26/31]
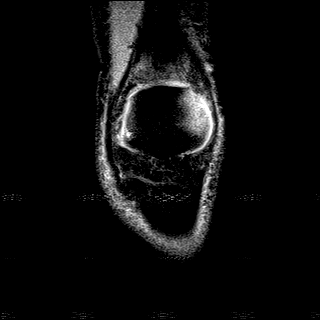
[im 31/31]
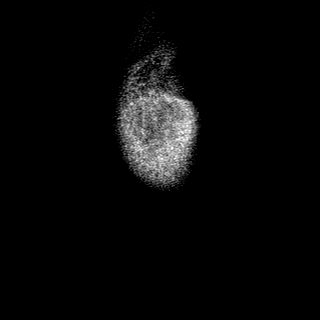

[Series 7: PD fat-sat · sagittal · 3.0mm · 0.50mm/px · 7 of 32 slices shown (3 of 4)]
[im 1/32]
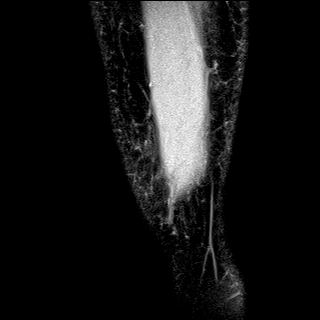
[im 6/32]
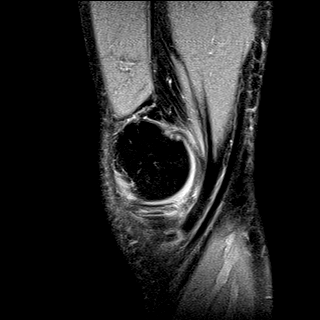
[im 11/32]
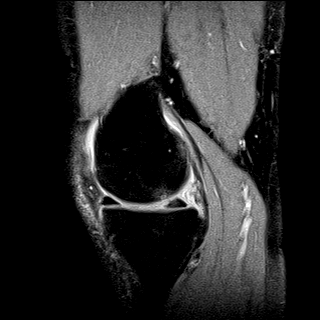
[im 16/32]
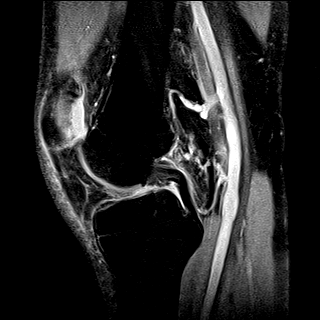
[im 21/32]
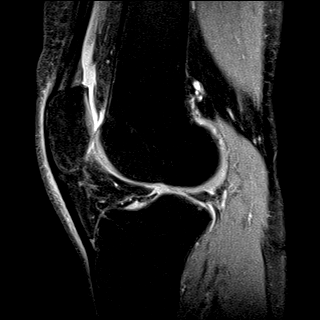
[im 26/32]
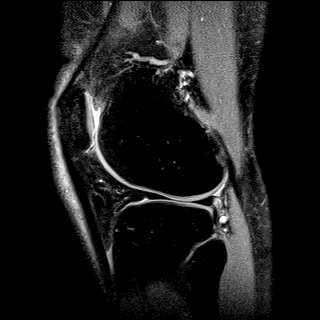
[im 32/32]
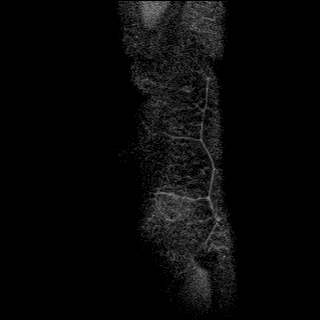

[Series 8: PD fat-sat · oblique · 2.0mm · 0.62mm/px · 3 of 13 slices shown (4 of 4)]
[im 1/13]
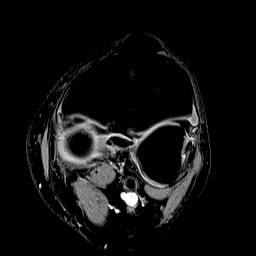
[im 7/13]
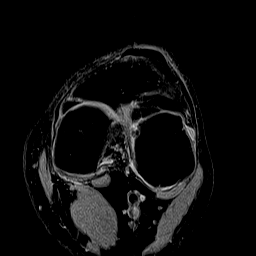
[im 13/13]
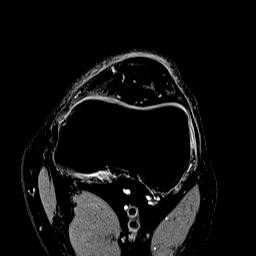

[37 of 40 positions shown; findings below may reference images not displayed]

FINDINGS: MENISCI

Medial meniscus:  Longitudinal tear is seen in the posterior horn.

Lateral meniscus:  Intact.

LIGAMENTS

Cruciates:  Intact.

Collaterals:  Intact.

CARTILAGE

Patellofemoral: A small focal fissure is seen in the far periphery
of cartilage of the medial facet in the inferior pole.

Medial: Small focus of hyaline cartilage loss is seen in the
posterior medial femoral condyle with mild underlying subchondral
edema.

Lateral:  Unremarkable.

Joint:  Trace amount of joint fluid.

Popliteal Fossa:  No Baker's cyst.

Extensor Mechanism:  Intact.

Bones:  No fracture or worrisome marrow lesion.

Other: None
IMPRESSION: Longitudinal tear posterior horn medial meniscus.

Mild medial and patellofemoral degenerative change for

## 2018-10-26 ENCOUNTER — Encounter: Payer: Self-pay | Admitting: *Deleted

## 2018-11-08 ENCOUNTER — Encounter: Payer: Self-pay | Admitting: *Deleted

## 2018-11-08 ENCOUNTER — Ambulatory Visit
Admission: RE | Admit: 2018-11-08 | Discharge: 2018-11-08 | Disposition: A | Discharge status: Home / Self Care | Payer: No Typology Code available for payment source | Attending: Ophthalmology | Admitting: Ophthalmology

## 2018-11-08 ENCOUNTER — Encounter
Admission: RE | Disposition: A | Discharge status: Home / Self Care | Payer: Self-pay | Source: Home / Self Care | Attending: Ophthalmology

## 2018-11-08 ENCOUNTER — Ambulatory Visit: Payer: No Typology Code available for payment source | Admitting: Anesthesiology

## 2018-11-08 ENCOUNTER — Other Ambulatory Visit: Payer: Self-pay

## 2018-11-08 DIAGNOSIS — N400 Enlarged prostate without lower urinary tract symptoms: Secondary | ICD-10-CM | POA: Diagnosis not present

## 2018-11-08 DIAGNOSIS — H2511 Age-related nuclear cataract, right eye: Secondary | ICD-10-CM | POA: Diagnosis not present

## 2018-11-08 DIAGNOSIS — Z87891 Personal history of nicotine dependence: Secondary | ICD-10-CM | POA: Insufficient documentation

## 2018-11-08 DIAGNOSIS — Z79899 Other long term (current) drug therapy: Secondary | ICD-10-CM | POA: Diagnosis not present

## 2018-11-08 HISTORY — PX: CATARACT EXTRACTION W/PHACO: SHX586

## 2018-11-08 HISTORY — DX: Gastro-esophageal reflux disease without esophagitis: K21.9

## 2018-11-08 SURGERY — PHACOEMULSIFICATION, CATARACT, WITH IOL INSERTION
Anesthesia: Monitor Anesthesia Care | Site: Eye | Laterality: Right

## 2018-11-08 MED ORDER — SODIUM CHLORIDE 0.9 % IV SOLN
INTRAVENOUS | Status: DC
  Administered 2018-11-08: 08:00:00 via INTRAVENOUS

## 2018-11-08 MED ORDER — MIDAZOLAM HCL 2 MG/2ML IJ SOLN
INTRAMUSCULAR | Status: DC | PRN
  Administered 2018-11-08 (×2): 1 mg via INTRAVENOUS

## 2018-11-08 MED ORDER — POVIDONE-IODINE 5 % OP SOLN
OPHTHALMIC | Status: DC | PRN
  Administered 2018-11-08: 1 via OPHTHALMIC

## 2018-11-08 MED ORDER — ARMC OPHTHALMIC DILATING DROPS
1.0000 "application " | OPHTHALMIC | Status: AC
  Administered 2018-11-08 (×3): 1 via OPHTHALMIC

## 2018-11-08 MED ORDER — EPINEPHRINE PF 1 MG/ML IJ SOLN
INTRAMUSCULAR | Status: AC
Start: 2018-11-08 — End: ?
  Filled 2018-11-08: qty 2

## 2018-11-08 MED ORDER — ARMC OPHTHALMIC DILATING DROPS
OPHTHALMIC | Status: AC
  Administered 2018-11-08: 1 via OPHTHALMIC
  Filled 2018-11-08: qty 0.5

## 2018-11-08 MED ORDER — MOXIFLOXACIN HCL 0.5 % OP SOLN
OPHTHALMIC | Status: AC
  Filled 2018-11-08: qty 3

## 2018-11-08 MED ORDER — FENTANYL CITRATE (PF) 100 MCG/2ML IJ SOLN
INTRAMUSCULAR | Status: DC | PRN
  Administered 2018-11-08 (×3): 25 ug via INTRAVENOUS

## 2018-11-08 MED ORDER — LIDOCAINE HCL (PF) 4 % IJ SOLN
INTRAOCULAR | Status: DC | PRN
  Administered 2018-11-08: 4 mL via OPHTHALMIC

## 2018-11-08 MED ORDER — SODIUM HYALURONATE 23 MG/ML IO SOLN
INTRAOCULAR | Status: AC
  Filled 2018-11-08: qty 0.6

## 2018-11-08 MED ORDER — MOXIFLOXACIN HCL 0.5 % OP SOLN: 1.0000 [drp] | OPHTHALMIC | Status: DC | PRN

## 2018-11-08 MED ORDER — TETRACAINE HCL 0.5 % OP SOLN
1.0000 [drp] | OPHTHALMIC | Status: AC | PRN
  Administered 2018-11-08 (×3): 1 [drp] via OPHTHALMIC

## 2018-11-08 MED ORDER — TETRACAINE HCL 0.5 % OP SOLN
OPHTHALMIC | Status: AC
  Administered 2018-11-08: 1 [drp] via OPHTHALMIC
  Filled 2018-11-08: qty 4

## 2018-11-08 MED ORDER — LIDOCAINE HCL (PF) 4 % IJ SOLN
INTRAMUSCULAR | Status: AC
  Filled 2018-11-08: qty 5

## 2018-11-08 MED ORDER — EPINEPHRINE PF 1 MG/ML IJ SOLN
INTRAOCULAR | Status: DC | PRN
  Administered 2018-11-08: 09:00:00 via OPHTHALMIC

## 2018-11-08 MED ORDER — FENTANYL CITRATE (PF) 100 MCG/2ML IJ SOLN
INTRAMUSCULAR | Status: AC
  Filled 2018-11-08: qty 2

## 2018-11-08 MED ORDER — CARBACHOL 0.01 % IO SOLN
INTRAOCULAR | Status: DC | PRN
  Administered 2018-11-08: 0.5 mL via INTRAOCULAR

## 2018-11-08 MED ORDER — MOXIFLOXACIN HCL 0.5 % OP SOLN
OPHTHALMIC | Status: DC | PRN
  Administered 2018-11-08: 0.2 mL via OPHTHALMIC

## 2018-11-08 MED ORDER — SODIUM HYALURONATE 10 MG/ML IO SOLN
INTRAOCULAR | Status: DC | PRN
  Administered 2018-11-08: 0.55 mL via INTRAOCULAR

## 2018-11-08 MED ORDER — MIDAZOLAM HCL 2 MG/2ML IJ SOLN
INTRAMUSCULAR | Status: AC
  Filled 2018-11-08: qty 2

## 2018-11-08 MED ORDER — SODIUM HYALURONATE 23 MG/ML IO SOLN
INTRAOCULAR | Status: DC | PRN
  Administered 2018-11-08: 0.6 mL via INTRAOCULAR

## 2018-11-08 MED ORDER — POVIDONE-IODINE 5 % OP SOLN
OPHTHALMIC | Status: AC
Start: 2018-11-08 — End: ?
  Filled 2018-11-08: qty 30

## 2018-11-08 SURGICAL SUPPLY — 18 items
DISSECTOR HYDRO NUCLEUS 50X22 (MISCELLANEOUS) ×2 IMPLANT
GLOVE BIO SURGEON STRL SZ8 (GLOVE) ×2 IMPLANT
GLOVE BIOGEL M 6.5 STRL (GLOVE) ×2 IMPLANT
GLOVE SURG LX 7.5 STRW (GLOVE) ×1
GLOVE SURG LX STRL 7.5 STRW (GLOVE) ×1 IMPLANT
GOWN STRL REUS W/ TWL LRG LVL3 (GOWN DISPOSABLE) ×2 IMPLANT
GOWN STRL REUS W/TWL LRG LVL3 (GOWN DISPOSABLE) ×2
LABEL CATARACT MEDS ST (LABEL) ×2 IMPLANT
LENS IOL ACRSF IQ TRC 6 18.0 IMPLANT
LENS IOL ACRYSOF IQ TORIC 18.0 ×1 IMPLANT
LENS IOL IQ TORIC 6 18.0 ×1 IMPLANT
PACK CATARACT (MISCELLANEOUS) ×2 IMPLANT
PACK CATARACT KING (MISCELLANEOUS) ×2 IMPLANT
PACK EYE AFTER SURG (MISCELLANEOUS) ×2 IMPLANT
SOL BSS BAG (MISCELLANEOUS) ×2
SOLUTION BSS BAG (MISCELLANEOUS) ×1 IMPLANT
WATER STERILE IRR 250ML POUR (IV SOLUTION) ×2 IMPLANT
WIPE NON LINTING 3.25X3.25 (MISCELLANEOUS) ×2 IMPLANT

## 2018-11-08 NOTE — Op Note (Signed)
OPERATIVE NOTE  Ronald Fitzgerald [id number] 11/08/2018   PREOPERATIVE DIAGNOSIS:  Nuclear sclerotic cataract right eye.  H25.11   POSTOPERATIVE DIAGNOSIS:    Nuclear sclerotic cataract right eye.     PROCEDURE:  Phacoemusification with posterior chamber intraocular lens placement of the right eye   LENS:   Implant Name Type Inv. Item Serial No. Manufacturer Lot No. LRB No. Used  LENS IOL TORIC 18.0 - [id number]  LENS IOL TORIC 18.0 [id number] [employer]CON  Right 1       [id number] +18.0 at 002 degrees   ULTRASOUND TIME: 0 minutes 38 seconds.  CDE 3.80   SURGEON:  Willey Blade, MD, MPH  ANESTHESIOLOGIST: Anesthesiologist: Jovita Gamma, MD CRNA: Oletta Lamas, CRNA   ANESTHESIA:  Topical with tetracaine drops augmented with 1% preservative-free intracameral lidocaine.  ESTIMATED BLOOD LOSS: less than 1 mL.   COMPLICATIONS:  None.   DESCRIPTION OF PROCEDURE:  The patient was identified in the holding room and transported to the operating room and placed in the supine position under the operating microscope.  The right eye was identified as the operative eye and it was prepped and draped in the usual sterile ophthalmic fashion.  The verion system was registered.   A 1.0 millimeter clear-corneal paracentesis was made at the 10:30 position. 0.5 ml of preservative-free 1% lidocaine with epinephrine was injected into the anterior chamber.  The anterior chamber was filled with Healon 5 viscoelastic.  A 2.4 millimeter keratome was used to make a near-clear corneal incision at the 8:00 position.  A curvilinear capsulorrhexis was made with a cystotome and capsulorrhexis forceps.  Balanced salt solution was used to hydrodissect and hydrodelineate the nucleus.   Phacoemulsification was then used in stop and chop fashion to remove the lens nucleus and epinucleus.  The remaining cortex was then removed using the irrigation and aspiration handpiece. Healon was then placed into the  capsular bag to distend it for lens placement.  A lens was then injected into the capsular bag.  The remaining viscoelastic was aspirated.  The verion guidance system was utilized to orient the lens at 002 degrees without difficulty.   Wounds were hydrated with balanced salt solution.  The anterior chamber was inflated to a physiologic pressure with balanced salt solution.  The lens was well centered in good position.  Intracameral vigamox 0.1 mL undiluted was injected into the eye and a drop placed onto the ocular surface.  No wound leaks were noted.  The patient was taken to the recovery room in stable condition without complications of anesthesia or surgery  Willey Blade 11/08/2018, 9:34 AM

## 2018-11-08 NOTE — Anesthesia Post-op Follow-up Note (Signed)
Anesthesia QCDR form completed.        

## 2018-11-08 NOTE — H&P (Signed)

## 2018-11-08 NOTE — Anesthesia Postprocedure Evaluation (Signed)
Anesthesia Post Note  Patient: Ronald Fitzgerald  Procedure(s) Performed: CATARACT EXTRACTION PHACO AND INTRAOCULAR LENS PLACEMENT (IOC) (Right Eye)  Patient location during evaluation: PACU Anesthesia Type: MAC Level of consciousness: awake and alert Pain management: pain level controlled Vital Signs Assessment: post-procedure vital signs reviewed and stable Respiratory status: spontaneous breathing, nonlabored ventilation and respiratory function stable Cardiovascular status: stable and blood pressure returned to baseline Postop Assessment: no apparent nausea or vomiting Anesthetic complications: no     Last Vitals:  Vitals:   11/08/18 0938 11/08/18 0947  BP: 111/74 109/78  Pulse: (!) 54 (!) 54  Resp: 16 16  Temp: 36.7 C   SpO2: 99% 98%    Last Pain:  Vitals:   11/08/18 0947  TempSrc:   PainSc: 0-No pain                 Durenda Hurt

## 2018-11-08 NOTE — Transfer of Care (Signed)
Immediate Anesthesia Transfer of Care Note  Patient: Ronald Fitzgerald  Procedure(s) Performed: CATARACT EXTRACTION PHACO AND INTRAOCULAR LENS PLACEMENT (IOC) (Right Eye)  Patient Location: PACU  Anesthesia Type:MAC  Level of Consciousness: awake  Airway & Oxygen Therapy: Patient Spontanous Breathing  Post-op Assessment: Report given to RN and Post -op Vital signs reviewed and stable  Post vital signs: stable  Last Vitals:  Vitals Value Taken Time  BP 111/74 11/08/2018  9:38 AM  Temp 36.7 C 11/08/2018  9:38 AM  Pulse 54 11/08/2018  9:38 AM  Resp 16 11/08/2018  9:38 AM  SpO2 99 % 11/08/2018  9:38 AM    Last Pain:  Vitals:   11/08/18 0938  TempSrc: Oral  PainSc: 0-No pain         Complications: No apparent anesthesia complications

## 2018-11-08 NOTE — Discharge Instructions (Signed)
Eye Surgery Discharge Instructions    Expect mild scratchy sensation or mild soreness. DO NOT RUB YOUR EYE!  The day of surgery:  Minimal physical activity, but bed rest is not required  No reading, computer work, or close hand work  No bending, lifting, or straining.  May watch TV  For 24 hours:  No driving, legal decisions, or alcoholic beverages  Safety precautions  Eat anything you prefer: It is better to start with liquids, then soup then solid foods.  _____ Eye patch should be worn until postoperative exam tomorrow.  ____ Solar shield eyeglasses should be worn for comfort in the sunlight/patch while sleeping  Resume all regular medications including aspirin or Coumadin if these were discontinued prior to surgery. You may shower, bathe, shave, or wash your hair. Tylenol may be taken for mild discomfort.  Call your doctor if you experience significant pain, nausea, or vomiting, fever > 101 or other signs of infection. [phone] or [phone] Specific instructions:  Follow-up Information    [place] [facility], Bradley Mark, MD Follow up on 11/09/2018.   Specialty:  Ophthalmology Why:  10:25 am Contact information: [address] [place] [address] [phone]

## 2018-11-08 NOTE — Anesthesia Preprocedure Evaluation (Addendum)
Anesthesia Evaluation  Patient identified by MRN, date of birth, ID band Patient awake    Reviewed: Allergy & Precautions, NPO status , Patient's Chart, lab work & pertinent test results  History of Anesthesia Complications Negative for: history of anesthetic complications  Airway Mallampati: II       Dental  (+) Partial Upper   Pulmonary neg sleep apnea, neg COPD, former smoker,           Cardiovascular Exercise Tolerance: Good (-) hypertension(-) CAD and (-) Past MI  - Systolic murmurs    Neuro/Psych  Headaches, negative psych ROS   GI/Hepatic GERD  Controlled,(+) Hepatitis -, C  Endo/Other  negative endocrine ROSneg diabetes  Renal/GU negative Renal ROS     Musculoskeletal negative musculoskeletal ROS (+)   Abdominal (+) - obese,   Peds  Hematology negative hematology ROS (+)   Anesthesia Other Findings Past Medical History: No date: BPH (benign prostatic hyperplasia) No date: Headache     Comment: MIGRAINES No date: Hepatitis     Comment: C-PT STATES THAT HEP C IS RESOLVED 05/2016: Pyelonephritis 05/2016: Sepsis ([place])     Comment: FROM UTI   Reproductive/Obstetrics                            Anesthesia Physical  Anesthesia Plan  ASA: II  Anesthesia Plan: MAC   Post-op Pain Management:    Induction: Intravenous  PONV Risk Score and Plan:   Airway Management Planned: Natural Airway  Additional Equipment:   Intra-op Plan:   Post-operative Plan:   Informed Consent: I have reviewed the patients History and Physical, chart, labs and discussed the procedure including the risks, benefits and alternatives for the proposed anesthesia with the patient or authorized representative who has indicated his/her understanding and acceptance.     Plan Discussed with: CRNA and Anesthesiologist  Anesthesia Plan Comments:         Anesthesia Quick Evaluation

## 2018-11-09 ENCOUNTER — Encounter: Payer: Self-pay | Admitting: Ophthalmology

## 2018-11-30 NOTE — Discharge Instructions (Signed)

## 2018-12-04 ENCOUNTER — Ambulatory Visit: Payer: No Typology Code available for payment source | Admitting: Anesthesiology

## 2018-12-04 ENCOUNTER — Ambulatory Visit
Admission: RE | Admit: 2018-12-04 | Discharge: 2018-12-04 | Disposition: A | Discharge status: Home / Self Care | Payer: No Typology Code available for payment source | Attending: Ophthalmology | Admitting: Ophthalmology

## 2018-12-04 ENCOUNTER — Encounter
Admission: RE | Disposition: A | Discharge status: Home / Self Care | Payer: Self-pay | Source: Home / Self Care | Attending: Ophthalmology

## 2018-12-04 DIAGNOSIS — Z9841 Cataract extraction status, right eye: Secondary | ICD-10-CM | POA: Diagnosis not present

## 2018-12-04 DIAGNOSIS — T8522XA Displacement of intraocular lens, initial encounter: Secondary | ICD-10-CM | POA: Insufficient documentation

## 2018-12-04 DIAGNOSIS — Z961 Presence of intraocular lens: Secondary | ICD-10-CM | POA: Diagnosis not present

## 2018-12-04 DIAGNOSIS — Z87891 Personal history of nicotine dependence: Secondary | ICD-10-CM | POA: Diagnosis not present

## 2018-12-04 DIAGNOSIS — Z9842 Cataract extraction status, left eye: Secondary | ICD-10-CM | POA: Diagnosis not present

## 2018-12-04 DIAGNOSIS — X58XXXA Exposure to other specified factors, initial encounter: Secondary | ICD-10-CM | POA: Diagnosis not present

## 2018-12-04 HISTORY — PX: REPOSITION OF LENS: SHX6069

## 2018-12-04 SURGERY — REPOSITIONING, IOL
Anesthesia: Monitor Anesthesia Care | Laterality: Right

## 2018-12-04 MED ORDER — LIDOCAINE HCL (PF) 2 % IJ SOLN
INTRAOCULAR | Status: DC | PRN
  Administered 2018-12-04: 1 mL via INTRAOCULAR

## 2018-12-04 MED ORDER — SODIUM HYALURONATE 10 MG/ML IO SOLN
INTRAOCULAR | Status: DC | PRN
  Administered 2018-12-04: 0.55 mL via INTRAOCULAR

## 2018-12-04 MED ORDER — MIDAZOLAM HCL 2 MG/2ML IJ SOLN
INTRAMUSCULAR | Status: DC | PRN
  Administered 2018-12-04 (×2): 1 mg via INTRAVENOUS

## 2018-12-04 MED ORDER — LACTATED RINGERS IV SOLN: INTRAVENOUS | Status: DC

## 2018-12-04 MED ORDER — FENTANYL CITRATE (PF) 100 MCG/2ML IJ SOLN
INTRAMUSCULAR | Status: DC | PRN
  Administered 2018-12-04 (×2): 50 ug via INTRAVENOUS

## 2018-12-04 MED ORDER — BSS IO SOLN
INTRAOCULAR | Status: DC | PRN
  Administered 2018-12-04: 30 mL via INTRAOCULAR

## 2018-12-04 MED ORDER — MOXIFLOXACIN HCL 0.5 % OP SOLN
OPHTHALMIC | Status: DC | PRN
  Administered 2018-12-04: 0.2 mL via OPHTHALMIC

## 2018-12-04 MED ORDER — TETRACAINE HCL 0.5 % OP SOLN
1.0000 [drp] | OPHTHALMIC | Status: DC | PRN
  Administered 2018-12-04 (×2): 1 [drp] via OPHTHALMIC

## 2018-12-04 MED ORDER — ARMC OPHTHALMIC DILATING DROPS
1.0000 "application " | OPHTHALMIC | Status: DC | PRN
  Administered 2018-12-04 (×2): 1 via OPHTHALMIC

## 2018-12-04 SURGICAL SUPPLY — 18 items
CANNULA ANT/CHMB 27G (MISCELLANEOUS) ×2 IMPLANT
CANNULA ANT/CHMB 27GA (MISCELLANEOUS) ×6 IMPLANT
DISSECTOR HYDRO NUCLEUS 50X22 (MISCELLANEOUS) ×3 IMPLANT
GLOVE SURG LX 7.5 STRW (GLOVE) ×2
GLOVE SURG LX STRL 7.5 STRW (GLOVE) ×1 IMPLANT
GLOVE SURG SYN 8.5  E (GLOVE) ×2
GLOVE SURG SYN 8.5 E (GLOVE) ×1 IMPLANT
GLOVE SURG SYN 8.5 PF PI (GLOVE) ×1 IMPLANT
GOWN STRL REUS W/ TWL LRG LVL3 (GOWN DISPOSABLE) ×2 IMPLANT
GOWN STRL REUS W/TWL LRG LVL3 (GOWN DISPOSABLE) ×4
MARKER SKIN DUAL TIP RULER LAB (MISCELLANEOUS) ×3 IMPLANT
PACK DR. KING ARMS (PACKS) ×3 IMPLANT
PACK EYE AFTER SURG (MISCELLANEOUS) ×3 IMPLANT
PACK OPTHALMIC (MISCELLANEOUS) ×3 IMPLANT
SYR 3ML LL SCALE MARK (SYRINGE) ×3 IMPLANT
SYR TB 1ML LUER SLIP (SYRINGE) ×3 IMPLANT
WATER STERILE IRR 500ML POUR (IV SOLUTION) ×3 IMPLANT
WIPE NON LINTING 3.25X3.25 (MISCELLANEOUS) ×3 IMPLANT

## 2018-12-04 NOTE — Op Note (Signed)
OPERATIVE NOTE  Ronald Fitzgerald [id number] 12/04/2018   PREOPERATIVE DIAGNOSIS:  malposition of toric intraocular lens   POSTOPERATIVE DIAGNOSIS:    malposition of toric intraocular lens   PROCEDURE:  CPT [id number] repositioning of intraocular lens prosthesis, requiring incision, right eye.   LENS: [id number] 18.0 rotated to 180 degrees (existing lens implant was rotated, no replaced).   SURGEON:  Willey Blade, MD, MPH  ANESTHESIOLOGIST: Anesthesiologist: Scarlette Slice, MD CRNA: Maree Krabbe, CRNA   ANESTHESIA:  Topical with tetracaine drops augmented with 1% preservative-free intracameral lidocaine.  ESTIMATED BLOOD LOSS: less than 1 mL.   COMPLICATIONS:  None.   DESCRIPTION OF PROCEDURE:  The patient was identified in the holding room and transported to the operating room and placed in the supine position under the operating microscope.  The verion system was registered and confirmed that the overly demonstrated appropriate intended alignment of the lens.    The right eye was identified as the operative eye and it was prepped and draped in the usual sterile ophthalmic fashion.   A 1.0 millimeter clear-corneal paracentesis was made at the 10:30 position. 0.5 ml of preservative-free 1% lidocaine with epinephrine was injected into the anterior chamber.  BSS on a 3 cc syringe was used to hydrodissect the lens from the capsular sac.    The anterior chamber was mostly filled with Healon viscoelastic.  A second paracentesis was created at 6:00.  A koch spatula and anis ball were used to rotate the lens to 180.    Wounds were hydrated with balanced salt solution.  The anterior chamber was inflated to a physiologic/slightly low pressure with balanced salt solution.   Intracameral vigamox 0.1 mL undiluted was injected into the eye and a drop placed onto the ocular surface.  Photos were taken demonstrating the appropriate position of the lens with overlay and purkinje images showing good  centration and rotation of the lens.  No wound leaks were noted.  The patient was taken to the recovery room in stable condition without complications of anesthesia or surgery  Willey Blade 12/04/2018, 12:56 PM

## 2018-12-04 NOTE — Anesthesia Postprocedure Evaluation (Signed)
Anesthesia Post Note  Patient: Ronald Fitzgerald  Procedure(s) Performed: REPOSITION OF LENS (Right )  Patient location during evaluation: PACU Anesthesia Type: MAC Level of consciousness: awake and alert Pain management: pain level controlled Vital Signs Assessment: post-procedure vital signs reviewed and stable Respiratory status: spontaneous breathing, nonlabored ventilation, respiratory function stable and patient connected to nasal cannula oxygen Cardiovascular status: stable and blood pressure returned to baseline Postop Assessment: no apparent nausea or vomiting Anesthetic complications: no    Alisa Graff

## 2018-12-04 NOTE — Transfer of Care (Signed)
Immediate Anesthesia Transfer of Care Note  Patient: Ronald Fitzgerald  Procedure(s) Performed: REPOSITION OF LENS (Right )  Patient Location: PACU  Anesthesia Type: MAC  Level of Consciousness: awake, alert  and patient cooperative  Airway and Oxygen Therapy: Patient Spontanous Breathing and Patient connected to supplemental oxygen  Post-op Assessment: Post-op Vital signs reviewed, Patient's Cardiovascular Status Stable, Respiratory Function Stable, Patent Airway and No signs of Nausea or vomiting  Post-op Vital Signs: Reviewed and stable  Complications: No apparent anesthesia complications

## 2018-12-04 NOTE — Anesthesia Preprocedure Evaluation (Signed)
Anesthesia Evaluation  Patient identified by MRN, date of birth, ID band Patient awake    Reviewed: Allergy & Precautions, H&P , NPO status , Patient's Chart, lab work & pertinent test results, reviewed documented beta blocker date and time   Airway Mallampati: II  TM Distance: >3 FB Neck ROM: full    Dental no notable dental hx.    Pulmonary former smoker,    Pulmonary exam normal breath sounds clear to auscultation       Cardiovascular Exercise Tolerance: Good negative cardio ROS   Rhythm:regular Rate:Normal     Neuro/Psych  Headaches, negative psych ROS   GI/Hepatic GERD  ,(+) Hepatitis - (resolved per patient), C  Endo/Other  negative endocrine ROS  Renal/GU negative Renal ROS  negative genitourinary   Musculoskeletal   Abdominal   Peds  Hematology negative hematology ROS (+)   Anesthesia Other Findings   Reproductive/Obstetrics negative OB ROS                             Anesthesia Physical Anesthesia Plan  ASA: II  Anesthesia Plan: MAC   Post-op Pain Management:    Induction:   PONV Risk Score and Plan:   Airway Management Planned:   Additional Equipment:   Intra-op Plan:   Post-operative Plan:   Informed Consent: I have reviewed the patients History and Physical, chart, labs and discussed the procedure including the risks, benefits and alternatives for the proposed anesthesia with the patient or authorized representative who has indicated his/her understanding and acceptance.     Dental Advisory Given  Plan Discussed with: CRNA  Anesthesia Plan Comments:         Anesthesia Quick Evaluation

## 2018-12-04 NOTE — Anesthesia Procedure Notes (Signed)
Procedure Name: MAC Date/Time: 12/04/2018 12:25 PM Performed by: Cameron Ali, CRNA Pre-anesthesia Checklist: Patient identified, Emergency Drugs available, Suction available, Timeout performed and Patient being monitored Patient Re-evaluated:Patient Re-evaluated prior to induction Oxygen Delivery Method: Nasal cannula Placement Confirmation: positive ETCO2

## 2018-12-04 NOTE — H&P (Signed)
The History and Physical notes are on paper, have been signed, and are to be scanned.   I have examined the patient and there are no changes to the H&P.   Attestation: 1. The patient's impairment of visual function is believed not to be correctable with a tolerable change in glasses or contact lenses. 2. 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 3. The patient desires surgical correction; the risks, benefits and alternatives have been explained; and a reasonable expectation exists that lens rotation will significantly improve both the visual and functional status of the patient.  I certify the statements are true to the best of my knowledge.  Willey Blade 12/04/2018 12:13 PM

## 2018-12-05 ENCOUNTER — Encounter: Payer: Self-pay | Admitting: Ophthalmology
